# Patient Record
Sex: Female | Born: 1965 | Race: Black or African American | Hispanic: No | Marital: Single | Smoking: Never smoker
Health system: Southern US, Community
[De-identification: ages and names within clinical notes are randomized; demographics above are authoritative.]

## PROBLEM LIST (undated history)

## (undated) DIAGNOSIS — T7840XA Allergy, unspecified, initial encounter: Secondary | ICD-10-CM

## (undated) DIAGNOSIS — D219 Benign neoplasm of connective and other soft tissue, unspecified: Secondary | ICD-10-CM

## (undated) DIAGNOSIS — M722 Plantar fascial fibromatosis: Secondary | ICD-10-CM

## (undated) DIAGNOSIS — R102 Pelvic and perineal pain unspecified side: Secondary | ICD-10-CM

## (undated) DIAGNOSIS — I10 Essential (primary) hypertension: Secondary | ICD-10-CM

## (undated) HISTORY — DX: Plantar fascial fibromatosis: M72.2

## (undated) HISTORY — PX: UTERINE ARTERY EMBOLIZATION: SHX2629

## (undated) HISTORY — PX: GYNECOLOGIC CRYOSURGERY: SHX857

## (undated) HISTORY — DX: Allergy, unspecified, initial encounter: T78.40XA

## (undated) HISTORY — DX: Benign neoplasm of connective and other soft tissue, unspecified: D21.9

## (undated) HISTORY — PX: WISDOM TOOTH EXTRACTION: SHX21

## (undated) HISTORY — DX: Essential (primary) hypertension: I10

---

## 2000-05-21 ENCOUNTER — Other Ambulatory Visit: Admission: RE | Admit: 2000-05-21 | Discharge: 2000-05-21 | Payer: Self-pay | Admitting: Obstetrics and Gynecology

## 2001-09-24 ENCOUNTER — Emergency Department (HOSPITAL_COMMUNITY): Admission: EM | Admit: 2001-09-24 | Discharge: 2001-09-25 | Payer: Self-pay | Admitting: Emergency Medicine

## 2001-10-02 ENCOUNTER — Encounter: Admission: RE | Admit: 2001-10-02 | Discharge: 2001-10-02 | Payer: Self-pay | Admitting: Family Medicine

## 2001-10-11 ENCOUNTER — Other Ambulatory Visit: Admission: RE | Admit: 2001-10-11 | Discharge: 2001-10-11 | Payer: Self-pay | Admitting: Obstetrics and Gynecology

## 2001-10-31 ENCOUNTER — Encounter: Admission: RE | Admit: 2001-10-31 | Discharge: 2001-10-31 | Payer: Self-pay | Admitting: Family Medicine

## 2002-04-01 ENCOUNTER — Encounter: Admission: RE | Admit: 2002-04-01 | Discharge: 2002-04-01 | Payer: Self-pay | Admitting: Sports Medicine

## 2002-04-22 ENCOUNTER — Encounter: Admission: RE | Admit: 2002-04-22 | Discharge: 2002-04-22 | Payer: Self-pay | Admitting: Sports Medicine

## 2002-04-28 ENCOUNTER — Encounter: Admission: RE | Admit: 2002-04-28 | Discharge: 2002-04-28 | Payer: Self-pay | Admitting: Sports Medicine

## 2002-04-28 ENCOUNTER — Encounter: Payer: Self-pay | Admitting: Sports Medicine

## 2002-05-22 ENCOUNTER — Encounter: Admission: RE | Admit: 2002-05-22 | Discharge: 2002-05-22 | Payer: Self-pay | Admitting: Sports Medicine

## 2002-09-04 ENCOUNTER — Encounter: Admission: RE | Admit: 2002-09-04 | Discharge: 2002-09-04 | Payer: Self-pay | Admitting: Sports Medicine

## 2002-10-29 ENCOUNTER — Encounter: Admission: RE | Admit: 2002-10-29 | Discharge: 2002-10-29 | Payer: Self-pay | Admitting: Family Medicine

## 2002-12-31 ENCOUNTER — Other Ambulatory Visit: Admission: RE | Admit: 2002-12-31 | Discharge: 2002-12-31 | Payer: Self-pay | Admitting: Obstetrics and Gynecology

## 2003-03-24 ENCOUNTER — Encounter: Admission: RE | Admit: 2003-03-24 | Discharge: 2003-03-24 | Payer: Self-pay | Admitting: Family Medicine

## 2003-04-20 ENCOUNTER — Encounter: Admission: RE | Admit: 2003-04-20 | Discharge: 2003-04-20 | Payer: Self-pay | Admitting: Family Medicine

## 2003-06-30 ENCOUNTER — Encounter: Admission: RE | Admit: 2003-06-30 | Discharge: 2003-06-30 | Payer: Self-pay | Admitting: Family Medicine

## 2003-07-21 ENCOUNTER — Encounter: Admission: RE | Admit: 2003-07-21 | Discharge: 2003-07-21 | Payer: Self-pay | Admitting: Family Medicine

## 2003-09-15 ENCOUNTER — Encounter: Admission: RE | Admit: 2003-09-15 | Discharge: 2003-09-15 | Payer: Self-pay | Admitting: Family Medicine

## 2003-10-09 ENCOUNTER — Ambulatory Visit (HOSPITAL_COMMUNITY): Admission: RE | Admit: 2003-10-09 | Discharge: 2003-10-09 | Payer: Self-pay | Admitting: Neurology

## 2003-11-18 ENCOUNTER — Ambulatory Visit: Payer: Self-pay | Admitting: Sports Medicine

## 2004-02-02 ENCOUNTER — Ambulatory Visit: Payer: Self-pay | Admitting: Sports Medicine

## 2004-07-13 ENCOUNTER — Ambulatory Visit: Payer: Self-pay | Admitting: Sports Medicine

## 2004-09-13 ENCOUNTER — Ambulatory Visit: Payer: Self-pay | Admitting: Family Medicine

## 2005-03-28 ENCOUNTER — Ambulatory Visit: Payer: Self-pay | Admitting: Sports Medicine

## 2006-04-17 ENCOUNTER — Encounter (INDEPENDENT_AMBULATORY_CARE_PROVIDER_SITE_OTHER): Payer: Self-pay | Admitting: Sports Medicine

## 2006-04-27 ENCOUNTER — Telehealth: Payer: Self-pay | Admitting: *Deleted

## 2006-04-30 ENCOUNTER — Ambulatory Visit: Payer: Self-pay | Admitting: Family Medicine

## 2006-04-30 ENCOUNTER — Encounter: Payer: Self-pay | Admitting: Family Medicine

## 2006-04-30 DIAGNOSIS — F450 Somatization disorder: Secondary | ICD-10-CM | POA: Insufficient documentation

## 2006-04-30 LAB — CONVERTED CEMR LAB
BUN: 14 mg/dL (ref 6–23)
Creatinine, Ser: 0.91 mg/dL (ref 0.40–1.20)
Sodium: 138 meq/L (ref 135–145)
TSH: 0.982 microintl units/mL (ref 0.350–5.50)

## 2006-05-01 ENCOUNTER — Encounter (INDEPENDENT_AMBULATORY_CARE_PROVIDER_SITE_OTHER): Payer: Self-pay | Admitting: Sports Medicine

## 2006-05-25 ENCOUNTER — Telehealth (INDEPENDENT_AMBULATORY_CARE_PROVIDER_SITE_OTHER): Payer: Self-pay | Admitting: Sports Medicine

## 2006-12-18 ENCOUNTER — Telehealth: Payer: Self-pay | Admitting: *Deleted

## 2006-12-20 ENCOUNTER — Telehealth (INDEPENDENT_AMBULATORY_CARE_PROVIDER_SITE_OTHER): Payer: Self-pay | Admitting: Family Medicine

## 2006-12-24 ENCOUNTER — Encounter (INDEPENDENT_AMBULATORY_CARE_PROVIDER_SITE_OTHER): Payer: Self-pay | Admitting: Family Medicine

## 2006-12-24 ENCOUNTER — Ambulatory Visit: Payer: Self-pay | Admitting: Family Medicine

## 2006-12-24 ENCOUNTER — Other Ambulatory Visit: Admission: RE | Admit: 2006-12-24 | Discharge: 2006-12-24 | Payer: Self-pay | Admitting: Family Medicine

## 2006-12-24 DIAGNOSIS — N63 Unspecified lump in unspecified breast: Secondary | ICD-10-CM

## 2006-12-24 LAB — CONVERTED CEMR LAB
Bilirubin Urine: NEGATIVE
Blood in Urine, dipstick: NEGATIVE
Chlamydia, DNA Probe: NEGATIVE
GC Probe Amp, Genital: NEGATIVE
Glucose, Urine, Semiquant: NEGATIVE
Ketones, urine, test strip: NEGATIVE
Nitrite: NEGATIVE
Protein, U semiquant: NEGATIVE
Specific Gravity, Urine: 1.025
Urobilinogen, UA: 0.2
WBC Urine, dipstick: NEGATIVE
Whiff Test: NEGATIVE
pH: 6

## 2006-12-25 ENCOUNTER — Telehealth (INDEPENDENT_AMBULATORY_CARE_PROVIDER_SITE_OTHER): Payer: Self-pay | Admitting: *Deleted

## 2006-12-29 LAB — CONVERTED CEMR LAB: Pap Smear: NORMAL

## 2006-12-31 ENCOUNTER — Encounter: Payer: Self-pay | Admitting: Family Medicine

## 2007-04-22 ENCOUNTER — Telehealth: Payer: Self-pay | Admitting: *Deleted

## 2007-04-29 ENCOUNTER — Encounter: Payer: Self-pay | Admitting: Family Medicine

## 2007-09-13 ENCOUNTER — Telehealth: Payer: Self-pay | Admitting: *Deleted

## 2007-09-18 ENCOUNTER — Ambulatory Visit: Payer: Self-pay | Admitting: Family Medicine

## 2007-09-18 ENCOUNTER — Encounter (INDEPENDENT_AMBULATORY_CARE_PROVIDER_SITE_OTHER): Payer: Self-pay | Admitting: Family Medicine

## 2007-09-18 LAB — CONVERTED CEMR LAB
MCHC: 32.1 g/dL (ref 30.0–36.0)
Platelets: 186 10*3/uL (ref 150–400)
RBC: 4.69 M/uL (ref 3.87–5.11)
TSH: 0.802 microintl units/mL (ref 0.350–4.50)

## 2007-09-20 ENCOUNTER — Encounter (INDEPENDENT_AMBULATORY_CARE_PROVIDER_SITE_OTHER): Payer: Self-pay | Admitting: Family Medicine

## 2007-10-02 ENCOUNTER — Ambulatory Visit: Payer: Self-pay | Admitting: Family Medicine

## 2007-10-02 ENCOUNTER — Telehealth: Payer: Self-pay | Admitting: *Deleted

## 2007-10-09 ENCOUNTER — Ambulatory Visit: Payer: Self-pay | Admitting: Family Medicine

## 2007-10-10 ENCOUNTER — Encounter: Admission: RE | Admit: 2007-10-10 | Discharge: 2007-10-10 | Payer: Self-pay | Admitting: Family Medicine

## 2007-10-10 ENCOUNTER — Encounter: Payer: Self-pay | Admitting: Family Medicine

## 2007-10-10 ENCOUNTER — Telehealth (INDEPENDENT_AMBULATORY_CARE_PROVIDER_SITE_OTHER): Payer: Self-pay | Admitting: Family Medicine

## 2008-03-27 ENCOUNTER — Encounter: Payer: Self-pay | Admitting: Family Medicine

## 2008-03-31 ENCOUNTER — Encounter: Payer: Self-pay | Admitting: Family Medicine

## 2008-04-21 ENCOUNTER — Encounter: Payer: Self-pay | Admitting: Family Medicine

## 2008-04-22 ENCOUNTER — Ambulatory Visit: Payer: Self-pay | Admitting: Family Medicine

## 2008-04-22 ENCOUNTER — Telehealth: Payer: Self-pay | Admitting: Family Medicine

## 2008-04-24 ENCOUNTER — Telehealth (INDEPENDENT_AMBULATORY_CARE_PROVIDER_SITE_OTHER): Payer: Self-pay | Admitting: *Deleted

## 2008-04-27 ENCOUNTER — Telehealth (INDEPENDENT_AMBULATORY_CARE_PROVIDER_SITE_OTHER): Payer: Self-pay | Admitting: Family Medicine

## 2008-04-27 ENCOUNTER — Encounter: Admission: RE | Admit: 2008-04-27 | Discharge: 2008-04-27 | Payer: Self-pay | Admitting: Family Medicine

## 2008-06-16 ENCOUNTER — Telehealth (INDEPENDENT_AMBULATORY_CARE_PROVIDER_SITE_OTHER): Payer: Self-pay | Admitting: *Deleted

## 2008-07-28 ENCOUNTER — Encounter: Payer: Self-pay | Admitting: Family Medicine

## 2008-08-19 ENCOUNTER — Encounter: Payer: Self-pay | Admitting: Family Medicine

## 2008-09-30 ENCOUNTER — Ambulatory Visit: Payer: Self-pay | Admitting: Family Medicine

## 2008-09-30 DIAGNOSIS — R0789 Other chest pain: Secondary | ICD-10-CM

## 2008-09-30 LAB — CONVERTED CEMR LAB
AST: 18 units/L (ref 0–37)
Albumin: 4.1 g/dL (ref 3.5–5.2)
Alkaline Phosphatase: 43 units/L (ref 39–117)
MCHC: 32 g/dL (ref 30.0–36.0)
RBC: 4.85 M/uL (ref 3.87–5.11)
RDW: 13.4 % (ref 11.5–15.5)
Total Bilirubin: 0.3 mg/dL (ref 0.3–1.2)
Total Protein: 7.2 g/dL (ref 6.0–8.3)
WBC: 3.5 10*3/uL — ABNORMAL LOW (ref 4.0–10.5)

## 2008-10-01 ENCOUNTER — Encounter: Payer: Self-pay | Admitting: Family Medicine

## 2008-10-12 ENCOUNTER — Encounter: Payer: Self-pay | Admitting: Family Medicine

## 2008-10-13 ENCOUNTER — Encounter: Payer: Self-pay | Admitting: Family Medicine

## 2008-10-14 ENCOUNTER — Telehealth: Payer: Self-pay | Admitting: Family Medicine

## 2008-10-14 ENCOUNTER — Ambulatory Visit: Payer: Self-pay | Admitting: Family Medicine

## 2008-10-14 LAB — CONVERTED CEMR LAB: Rapid Strep: NEGATIVE

## 2008-10-15 ENCOUNTER — Telehealth (INDEPENDENT_AMBULATORY_CARE_PROVIDER_SITE_OTHER): Payer: Self-pay | Admitting: *Deleted

## 2008-10-16 ENCOUNTER — Ambulatory Visit: Payer: Self-pay | Admitting: Family Medicine

## 2008-10-16 LAB — CONVERTED CEMR LAB: Whiff Test: NEGATIVE

## 2008-10-19 ENCOUNTER — Telehealth (INDEPENDENT_AMBULATORY_CARE_PROVIDER_SITE_OTHER): Payer: Self-pay | Admitting: *Deleted

## 2008-10-26 ENCOUNTER — Encounter: Payer: Self-pay | Admitting: Family Medicine

## 2008-10-27 ENCOUNTER — Encounter: Payer: Self-pay | Admitting: Family Medicine

## 2008-11-09 ENCOUNTER — Encounter: Payer: Self-pay | Admitting: Family Medicine

## 2009-01-14 ENCOUNTER — Ambulatory Visit: Payer: Self-pay | Admitting: Family Medicine

## 2009-01-14 DIAGNOSIS — N644 Mastodynia: Secondary | ICD-10-CM

## 2009-12-14 ENCOUNTER — Encounter: Payer: Self-pay | Admitting: Family Medicine

## 2010-01-11 ENCOUNTER — Ambulatory Visit: Payer: Self-pay | Admitting: Family Medicine

## 2010-01-14 ENCOUNTER — Encounter
Admission: RE | Admit: 2010-01-14 | Discharge: 2010-01-14 | Payer: Self-pay | Source: Home / Self Care | Attending: Family Medicine | Admitting: Family Medicine

## 2010-02-16 ENCOUNTER — Ambulatory Visit
Admission: RE | Admit: 2010-02-16 | Discharge: 2010-02-16 | Payer: Self-pay | Source: Home / Self Care | Attending: Family Medicine | Admitting: Family Medicine

## 2010-02-16 ENCOUNTER — Encounter: Payer: Self-pay | Admitting: Family Medicine

## 2010-02-16 LAB — CONVERTED CEMR LAB
BUN: 13 mg/dL (ref 6–23)
CO2: 25 meq/L (ref 19–32)
Calcium: 9 mg/dL (ref 8.4–10.5)
Chloride: 106 meq/L (ref 96–112)
Glucose, Bld: 94 mg/dL (ref 70–99)
LDL Cholesterol: 115 mg/dL — ABNORMAL HIGH (ref 0–99)
Sodium: 138 meq/L (ref 135–145)
Triglycerides: 36 mg/dL (ref <150)
VLDL: 7 mg/dL (ref 0–40)

## 2010-02-18 ENCOUNTER — Encounter: Payer: Self-pay | Admitting: Family Medicine

## 2010-02-20 ENCOUNTER — Encounter: Payer: Self-pay | Admitting: Neurology

## 2010-02-21 ENCOUNTER — Encounter: Payer: Self-pay | Admitting: Family Medicine

## 2010-03-01 NOTE — Miscellaneous (Signed)
  Clinical Lists Changes  Orders: Added new Test order of Mammogram (Mammogram) - Signed      Complete Medication List: 1)  Allegra 180 Mg Tabs (Fexofenadine hcl) .... One tablet daily.  patient has failed trials of claritin and zyrtec. pt requests brand only 2)  Cvs Lubricant Eye Drops 0.5 % Soln (Carboxymethylcellulose sodium) .... Apply to eyes as directed for relief of itching 3)  Ibu 600 Mg Tabs (Ibuprofen) .Marland Kitchen.. 1 by mouth every 6 hours as needed for pain 4)  Epipen 0.3 Mg/0.88ml (1:1000) Devi (Epinephrine hcl (anaphylaxis)) .... Use as directed  order faxed to [facility] for [facility] sent [place] a request for  te order--diag mammo w [place] if necessary last mammo 04/22/2007 faxed back w order attached

## 2010-03-01 NOTE — Assessment & Plan Note (Signed)
Summary: strep per pt?/[place]/neal   Vital Signs:  Patient profile:   45 year old female Height:      63 inches (160.02 cm) Weight:      174 pounds (79.09 kg) BMI:     30.93 BSA:     1.82 Temp:     98.4 degrees F (36.9 degrees C) Pulse rate:   49 / minute BP sitting:   120 / 75  (right arm) Cuff size:   regular  Vitals Entered By: Dennison Nancy RN (October 14, 2008 2:19 PM) CC: [place] for throat pain Is Patient Diabetic? No Pain Assessment Patient in pain? yes     Location: throat Intensity: 8 Onset of pain  5 days ago   Primary Care Provider:  Denny Levy MD  CC:  [place] for throat pain.  History of Present Illness: 45 yo with   1. Sore throat: described as "irritable." worse over the past 2 days. assoc with HA, bilateral ear pain. she denies congestion, runny nose, trouble swallowing, cough, fever/chills, vision changes, sick contacts, rash, N/V/D. she endorses seasonal allergies. chart review: ENT referral completed. patient wants to know if she has strep throat.  Habits & Providers  Alcohol-Tobacco-Diet     Tobacco Status: never  Allergies (verified): No Known Drug Allergies  Past History:  Past Medical History: does not like to take meds,  negative rhuem  w/up  06, multiple somatic complaints  Review of Systems       per HPI  Physical Exam  General:  alert, well-developed, well-nourished, and well-hydrated.  vitals reviewed.  Head:  Normocephalic and atraumatic without obvious abnormalities.  Eyes:  No corneal or conjunctival inflammation noted. EOMI. Perrla.  Ears:  R ear normal and L ear normal.   Nose:  Nasal mucosa are pink and moist without lesions or exudates. Mouth:  Oral mucosa and oropharynx without lesions or exudates.  Neck:  supple, full ROM, no masses. Lungs:  CTAB Heart:  normal rate and regular rhythm.   Skin:  Intact without suspicious lesions or rashes.   Impression & Recommendations:  Problem # 1:  SORE THROAT (ICD-462) Assessment  New  Rapid strep negative. Exam normal. Advised patient to treat symptoms with throat lozenges, hot tea with honey, salt water gargle, ibuprofen, allergy medication. Follow up with PCP/ENT.  Her updated medication list for this problem includes:    Ibu 600 Mg Tabs (Ibuprofen) .Marland Kitchen... 1 by mouth every 6 hours as needed for pain  Orders: [facility]- Est Level  3 ([address]213)  Complete Medication List: 1)  Allegra 180 Mg Tabs (Fexofenadine hcl) .... One tablet daily.  patient has failed trials of claritin and zyrtec. pt requests brand only 2)  Cvs Lubricant Eye Drops 0.5 % Soln (Carboxymethylcellulose sodium) .... Apply to eyes as directed for relief of itching 3)  Ibu 600 Mg Tabs (Ibuprofen) .Marland Kitchen.. 1 by mouth every 6 hours as needed for pain 4)  Epipen 0.3 Mg/0.31ml (1:1000) Devi (Epinephrine hcl (anaphylaxis)) .... Use as directed  Other Orders: Rapid Strep-[facility] (04540)  Patient Instructions: 1)  It was nice to meet you today. 2)  Good news, you do NOT have strep throat! 3)  Some ways to treat your pain: ibuprofen, salt water gargles, warm tea with honey, and throat lozenges. 4)  Make sure to take your allergy medications daily. Prescriptions: IBU 600 MG  TABS (IBUPROFEN) 1 by mouth every 6 hours as needed for pain  #90 x 1   Entered and Authorized by:  Helane Rima MD   Signed by:   Helane Rima MD on 10/14/2008   Method used:   Electronically to        [facility] [phone]* (retail)       [address] [place]  [address]       Ph: [phone] or [phone]       Fax: [phone]   RxID:   [id number] ALLEGRA 180 MG  TABS (FEXOFENADINE HCL) One tablet daily.  Patient has failed trials of Claritin and Zyrtec. Pt requests BRAND only  #30 x 12   Entered and Authorized by:   Helane Rima MD   Signed by:   Helane Rima MD on 10/14/2008   Method used:   Electronically to        [facility] [phone]* (retail)       [address], [place]  [address]        Ph: [phone] or [phone]       Fax: [phone]   RxID:   [id number]   VITAL SIGNS    Calculated Weight:   174 lb.     Height:     63 in.     Temperature:     98.4 deg F.     Pulse rate:     49    Blood Pressure:   120/75 mmHg    Vital Signs:  Patient profile:   45 year old female Height:      63 inches (160.02 cm) Weight:      174 pounds (79.09 kg) BMI:     30.93 BSA:     1.82 Temp:     98.4 degrees F (36.9 degrees C) Pulse rate:   49 / minute BP sitting:   120 / 75  (right arm) Cuff size:   regular  Vitals Entered By: Dennison Nancy RN (October 14, 2008 2:19 PM)   Laboratory Results  Date/Time Received: October 14, 2008 2:20 PM  Date/Time Reported: October 14, 2008 2:29 PM   Other Tests  Rapid Strep: negative Comments: ...............test performed by......Marland KitchenBonnie A. [place], [place] (ASCP)

## 2010-03-03 NOTE — Assessment & Plan Note (Signed)
Summary: breast pain,df   Vital Signs:  Patient profile:   45 year old female Height:      63 inches Weight:      170.4 pounds BMI:     30.29 Temp:     98.3 degrees F oral Pulse rate:   57 / minute BP sitting:   142 / 86  (left arm) Cuff size:   regular  Vitals Entered By: Garen Grams LPN (January 11, 2010 9:09 AM) CC: breast pain Is Patient Diabetic? No   Primary Care Provider:  Denny Levy MD  CC:  breast pain.  History of Present Illness: 45 yo female returning with bilateral breast pain. Pt states that the pain not any better was when seen about a year ago.  Pt states she wanted to be seen to make sure no changes.  Pt states that the pain comes and goes, not associated with any time of day, or food, or any type of movements.  Pt states that it seems to radiate from the side of the breast to the middle of the breast when it does occur.  No masses felt by her, no nipple discharge, no skin changes no discoloration.  Denies fever, chills, nausea, vomiting, diarrhea or constipation shortness of breath cough, or chest pain.  No pain with exertion.   Habits & Providers  Alcohol-Tobacco-Diet     Tobacco Status: never  Current Medications (verified): 1)  Allegra 180 Mg  Tabs (Fexofenadine Hcl) .... One Tablet Daily.  Patient Has Failed Trials of Claritin and Zyrtec. Pt Requests Brand Only 2)  Cvs Lubricant Eye Drops 0.5 %  Soln (Carboxymethylcellulose Sodium) .... Apply To Eyes As Directed For Relief of Itching 3)  Ibu 600 Mg  Tabs (Ibuprofen) .Marland Kitchen.. 1 By Mouth Every 6 Hours As Needed For Pain 4)  Epipen 0.3 Mg/0.75ml (1:1000)  Devi (Epinephrine Hcl (Anaphylaxis)) .... Use As Directed  Allergies (verified): No Known Drug Allergies  Physical Exam  General:  alert, well-developed, well-nourished, and well-hydrated.  mildly anxious Eyes:  No corneal or conjunctival inflammation noted. EOMI. Perrla.  Mouth:  Oral mucosa and oropharynx without lesions or exudates.  Breasts:  Left  breast- nipple inverted but is at bseline.  No discoloration. no nipple discharge,  mild fibrocystic changes at 2pm  right breast no abnormal findings at all  Lungs:  CTAB Heart:  normal rate and regular rhythm.   Abdomen:  soft, non-tender, and normal bowel sounds.     Impression & Recommendations:  Problem # 1:  BREAST PAIN, BILATERAL (ICD-611.71) Assessment Unchanged Pt appears very anxious about it.  No msk findings, will get diagnostic mammogram to rule out any masees, none found on exam today except mild fibrocystic changes on left side at 2 pm location, no LAD.   Will have pt follow up Q 3 months for breast exam to help alleviate some anxiety.  Told pt self exams are improtant but not to beat herself up on missing one from time to time.  Orders: [facility]- Est Level  3 (99213) Mammogram (Diagnostic) (Mammo)  Complete Medication List: 1)  Allegra 180 Mg Tabs (Fexofenadine hcl) .... One tablet daily.  patient has failed trials of claritin and zyrtec. pt requests brand only 2)  Cvs Lubricant Eye Drops 0.5 % Soln (Carboxymethylcellulose sodium) .... Apply to eyes as directed for relief of itching 3)  Ibu 600 Mg Tabs (Ibuprofen) .Marland Kitchen.. 1 by mouth every 6 hours as needed for pain 4)  Epipen 0.3 Mg/0.40ml (1:1000) Devi (Epinephrine  hcl (anaphylaxis)) .... Use as directed   Orders Added: 1)  [facility]- Est Level  3 [[address]] 2)  Mammogram (Diagnostic) [Mammo]

## 2010-03-03 NOTE — Assessment & Plan Note (Signed)
Summary: breast pain/bmc   Vital Signs:  Patient profile:   45 year old female Height:      63 inches Weight:      170 pounds Temp:     98.7 degrees F oral Pulse rate:   50 / minute Pulse rhythm:   regular BP sitting:   126 / 82  (right arm) Cuff size:   regular  Vitals Entered By: Loralee Pacas CMA (February 16, 2010 8:58 AM) CC: follow-up visit bilateral breast pain Is Patient Diabetic? No Pain Assessment Patient in pain? no      Comments pt is not having any pain at present she had a mammogram done since last ov and nothing was found.   Primary Care Provider:  Denny Levy MD  CC:  follow-up visit bilateral breast pain.  History of Present Illness: wants CPE--except she does not want her pap right now. She did have her mammogram. Is doing well. Working cleaning houses right now. No longer has medicaid---has orange card. Would like [place] back to Dr Stefano Gaul for pap and [place] as he did not do a transvaginal (evidently he did abdominal)---she is worried about her fibroids growing  continues to have occasional sharp breast pains which worry her an awful lot--her Mom had breast ca and her signs were evidently sharppains inher breast.   has a bruised area on her right shin--wonders how long it willtake to go away. Knocked against a oiece of furniture with her leg a couple of weeks ago. Itis getting better but worries her.   she is otehrwise without complaint  Habits & Providers  Alcohol-Tobacco-Diet     Tobacco Status: never  Exercise-Depression-Behavior     Have you felt down or hopeless? no     Have you felt little pleasure in things? no     Depression Counseling: not indicated; screening negative for depression     Seat Belt Use: always  Current Medications (verified): 1)  Allegra 180 Mg  Tabs (Fexofenadine Hcl) .... One Tablet Daily.  Patient Has Failed Trials of Claritin and Zyrtec. Pt Requests Brand Only 2)  Cvs Lubricant Eye Drops 0.5 %  Soln (Carboxymethylcellulose  Sodium) .... Apply To Eyes As Directed For Relief of Itching 3)  Ibu 600 Mg  Tabs (Ibuprofen) .Marland Kitchen.. 1 By Mouth Every 6 Hours As Needed For Pain 4)  Epipen 0.3 Mg/0.7ml (1:1000)  Devi (Epinephrine Hcl (Anaphylaxis)) .... 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    Physical Exam  General:  alert, well-developed, well-nourished, and well-hydrated.   Head:  normocephalic.   Eyes:  vision grossly intact, pupils equal, pupils round, and pupils reactive to light.   Ears:  R ear normal and L ear normal.   Neck:  supple, full ROM, no masses, no thyromegaly, and no carotid bruits.   Breasts:  deferred by patient Lungs:  normal respiratory effort.   Heart:  normal rate,  regular rhythm, and no murmur.   Abdomen:  soft, non-tender, normal bowel sounds, no distention, and no masses.   Genitalia:  deferred by patient Msk:  normal ROM, no joint tenderness, and no joint swelling.   small 2 cm bruised area lft shin. Pulses:  2+ B U/L extremity Extremities:  no edema Neurologic:  alert & oriented X3, strength normal in all extremities, gait normal, and DTRs symmetrical and normal.   Skin:  turgor normal and color normal.   Psych:  Oriented X3 and memory intact for recent and remote.  speech is a bit pressured as usual. asks  and answers quiestions appropriately.  Interactive and seems happy   Impression & Recommendations:  Problem # 1:  BREAST PAIN, BILATERAL (ICD-611.71)  again discussed benign nature of her intermittent breast pains. she willlikely continue with yearly mammogram Orders: [facility]- Est  Level 4 ([address])  Problem # 2:  CHEST PAIN, ATYPICAL (ICD-786.59) Orders: Basic Met-[facility] ([address]11) Lipid-[facility] ([id number]) [facility]- Est  Level 4 ([address]214)this is eally more of a beast pain but she is very worried about her healthwe will check basic labs. she is fasting  Problem # 3:  DISORDER, SOMATIZATION (ICD-300.81)  this iseems pretty well controlled at the moment.  Orders: [facility]- Est  Level 4 ([address])  Problem # 4:  Preventive Health Care (ICD-V70.0) she is overdue for pap--says she is not mentally "psyched" for it today---wants me to send her back to Dr Stefano Gaul for pap and he can re-eval her fibroids at that time.  If her orange card does not reimburse that practice she will call us back and get a pap in next 6 weeks.  Complete Medication List: 1)  Allegra 180 Mg Tabs (Fexofenadine hcl) .... One tablet daily.  patient has failed trials of claritin and zyrtec. pt requests brand only 2)  Cvs Lubricant Eye Drops 0.5 % Soln (Carboxymethylcellulose sodium) .... Apply to eyes as directed for relief of itching 3)  Ibu 600 Mg Tabs (Ibuprofen) .Marland Kitchen.. 1 by mouth every 6 hours as needed for pain 4)  Epipen 0.3 Mg/0.73ml (1:1000) Devi (Epinephrine hcl (anaphylaxis)) .... Use as directed Basic Met-[facility] [phone]) Lipid-[facility] [phone]) [facility]- Est  Level 4 ([address]214)   Orders Added: 1)  Basic Met-[facility] [02725-36644] 2)  Lipid-[facility] [80061-22930] 3)  [facility]- Est  Level 4 [03474]    Prevention & Chronic Care Immunizations   Influenza vaccine: Fluvax 3+  (12/24/2006)   Influenza vaccine due: 12/24/2007    Tetanus booster: 09/18/2007: Tdap   Tetanus booster due: 09/17/2017    Pneumococcal vaccine: Not documented  Other  Screening   Pap smear: 07/28/2008  (09/30/2008)   Pap smear due: 12/2009    Mammogram: BI-RADS CATEGORY 1:  Negative.^MM DIGITAL DIAGNOSTIC BILAT  (01/14/2010)   Mammogram due: 04/2009   Smoking status: never  (02/16/2010)  Lipids   Total Cholesterol: Not documented   LDL: Not documented   LDL Direct: 123  (09/30/2008)   HDL: Not documented   Triglycerides: Not documented    Impression & Recommendations:   Orders: [facility]- Est  Level 4 ([address])    Orders: Basic Met-[facility] ([id number]) Lipid-[facility] (51884-16606)   Orders: Basic Met-[facility] (30160-10932) Lipid-[facility] (35573-22025) [facility]- Est  Level 4 ([address]06)     Orders: [facility]- Est  Level 4 (23762)    Complete Medication List: 1)  Allegra 180 Mg Tabs (Fexofenadine hcl) .... One tablet daily.  patient has failed trials of claritin and zyrtec. pt requests brand only 2)  Cvs Lubricant Eye Drops 0.5 %  Soln (Carboxymethylcellulose sodium) .... Apply to eyes as directed for relief of itching 3)  Ibu 600 Mg Tabs (Ibuprofen) .Marland Kitchen.. 1 by mouth every 6 hours as needed for pain 4)  Epipen 0.3 Mg/0.68ml (1:1000) Devi (Epinephrine hcl (anaphylaxis)) .... Use as directed

## 2010-03-03 NOTE — Letter (Addendum)
Summary: Lipid Letter  [facility]  [address]   [place], [place] [address]   Phone: [phone]  Fax: [phone]    02/18/2010  Islah Eve [address] [place], [place]  [address]  Dear Rexene Edison:  We have carefully reviewed your last lipid profile from 02/16/2010 and the results are noted below with a summary of recommendations for lipid management.    Cholesterol:       175     Goal: < 200   HDL "good" Cholesterol:   53     Goal: > 45   LDL "bad" Cholesterol:   115     Goal: < 130   Triglycerides:       36     Goal: < 150    THIS LOOKS GREAT! All of your other labs including blood sugar, kidney function and electrolytes were normal.       Current Medications: 1)    Allegra 180 Mg  Tabs (Fexofenadine hcl) .... One tablet daily.  patient has failed trials of claritin and zyrtec. pt requests brand only 2)    Cvs Lubricant Eye Drops 0.5 %  Soln (Carboxymethylcellulose sodium) .... Apply to eyes as directed for relief of itching 3)    Ibu 600 Mg  Tabs (Ibuprofen) .Marland Kitchen.. 1 by mouth every 6 hours as needed for pain 4)    Epipen 0.3 Mg/0.67ml (1:1000)  Devi (Epinephrine hcl (anaphylaxis)) .... Use as directed  If you have any questions, please call. We appreciate being able to work with you.   Sincerely,    [facility] Denny Levy MD  Appended Document: Lipid Letter mailed

## 2010-03-10 ENCOUNTER — Encounter: Payer: Self-pay | Admitting: *Deleted

## 2010-03-24 ENCOUNTER — Telehealth: Payer: Self-pay | Admitting: *Deleted

## 2010-03-24 NOTE — Telephone Encounter (Signed)
Patient calls reporting she has been bothered with a vaginal irritation and scratchy throat since December. She is concerned that she has an overgrowth of yeast in her body and  wans to be checked.  Offered work in appointment for tomorrow but she  does not want to come because she is on her period. Appointment scheduled for next Tuesday. denies any discharge or odor or pain.

## 2010-03-29 ENCOUNTER — Ambulatory Visit: Payer: Self-pay | Admitting: Family Medicine

## 2010-04-06 ENCOUNTER — Ambulatory Visit (INDEPENDENT_AMBULATORY_CARE_PROVIDER_SITE_OTHER): Payer: Self-pay | Admitting: Family Medicine

## 2010-04-06 ENCOUNTER — Encounter: Payer: Self-pay | Admitting: Family Medicine

## 2010-04-06 DIAGNOSIS — Z124 Encounter for screening for malignant neoplasm of cervix: Secondary | ICD-10-CM

## 2010-04-06 DIAGNOSIS — N760 Acute vaginitis: Secondary | ICD-10-CM

## 2010-04-06 LAB — POCT WET PREP (WET MOUNT)
Clue Cells Wet Prep HPF POC: NEGATIVE
Yeast Wet Prep HPF POC: NEGATIVE

## 2010-04-06 NOTE — Progress Notes (Signed)
  Subjective:    Patient ID: Carrie Roth, female    DOB: Jun 05, 1965, 45 y.o.   MRN: [id number]  HPI  Concerned she has a candida infection in her vagina, possibly in her throat, maybe widespread in her body. Has been reading about candida infections on net and is quite worried. Symptoms are a "tingling" in her vagina ,ost of the time. No itching, no discharge, no pain, no bleeding. No abdominal pain and no fever. No concerns for STD--not sexually active currently. Also has a dry sensation in her throat and mouth much of the tie--no white areas on tongue--no mouth blisters or sores.  Has generally been feeling well  But is concerned about these issues. Also wants to get her pap smear done.  Review of Systems see hpi   Objective:   Physical Exam  Constitutional: She appears well-developed and well-nourished.  HENT:  Nose: Nose normal.  Mouth/Throat: Oropharynx is clear and moist. No oropharyngeal exudate.  Neck: Normal range of motion. No thyromegaly present.  Cardiovascular: Normal rate and regular rhythm.   Pulmonary/Chest: Effort normal and breath sounds normal. She has no wheezes.  Abdominal: Soft. Bowel sounds are normal.  Genitourinary: Vagina normal.       No adnexal masses or tenderness, her uterus is slightly enlarged (grapefruit size) no nodularity, no tenderness,. There is no vaginal d/c, no lesions or blisters. Cervix appears normal and no cervical motion tenderness.  Lymphadenopathy:    She has no cervical adenopathy.  Psychiatric:       A little anxious with some very mild pressured speech when she starts discussing her internet search re candida infections. Otherwise she is normally interactive and appears calm, asks and answers questions normally.          Assessment & Plan:  1. Concern for yeast infection--I think this is related to her somatization disorder. I inspected her OP, did wet prep and will contact her with results which I expect will be negative. I tried to  reassure her re generalized yeast infection, yeast in mouth etc. She will rtc prn re these issues and her next pap can be done in 1-3 years

## 2010-04-08 ENCOUNTER — Telehealth: Payer: Self-pay | Admitting: Family Medicine

## 2010-04-08 NOTE — Telephone Encounter (Signed)
Dwt Please let her know there was NO YEAST or BV on her wet prep. THANKS! Gean Laursen

## 2010-04-08 NOTE — Telephone Encounter (Signed)
LVM for patient to call back. ?

## 2010-04-11 NOTE — Telephone Encounter (Signed)
Attempted to contact pt with alternate number but that line is busy. Will try again later.Carrie Roth

## 2010-04-11 NOTE — Telephone Encounter (Signed)
LVM for patient to call back on the number she called and left for me..... Cell # [phone]

## 2010-04-11 NOTE — Telephone Encounter (Signed)
Spoke with patient and informed of results.

## 2010-04-11 NOTE — Telephone Encounter (Signed)
LVM for patient to call back. ?

## 2010-04-12 ENCOUNTER — Other Ambulatory Visit: Payer: Self-pay | Admitting: Family Medicine

## 2010-04-12 MED ORDER — EPINEPHRINE 0.3 MG/0.3ML IJ DEVI: 0.3000 mg | INTRAMUSCULAR | Status: DC

## 2010-04-12 MED ORDER — IBUPROFEN 600 MG PO TABS: 600.0000 mg | ORAL_TABLET | Freq: Four times a day (QID) | ORAL | Status: DC | PRN

## 2010-04-12 MED ORDER — FEXOFENADINE HCL 180 MG PO TABS: 180.0000 mg | ORAL_TABLET | Freq: Every day | ORAL | Status: DC

## 2010-04-12 NOTE — Telephone Encounter (Signed)
Dr. Jennette Kettle,  Ms Henderson stated that she requested refills at her last visit.  I started the refills for you however they seem to be a little more complicated than I thought... pls advise/edit and/or finish LOL!! Thanks bunches.  Laureen Ochs, Viann Shove

## 2010-04-12 NOTE — Telephone Encounter (Signed)
Was here the other day and thought that the doctor had called in refills for her -  Epipen, allergy meds & ibuprofen [facility] [address]

## 2010-04-19 ENCOUNTER — Encounter: Payer: Self-pay | Admitting: Family Medicine

## 2010-04-28 NOTE — Miscellaneous (Signed)
  Clinical Lists Changes       Complete Medication List: 1)  Allegra 180 Mg Tabs (Fexofenadine hcl) .... One tablet daily.  patient has failed trials of claritin and zyrtec. pt requests brand only 2)  Cvs Lubricant Eye Drops 0.5 % Soln (Carboxymethylcellulose sodium) .... Apply to eyes as directed for relief of itching 3)  Ibu 600 Mg Tabs (Ibuprofen) .Marland Kitchen.. 1 by mouth every 6 hours as needed for pain 4)  Epipen 0.3 Mg/0.7ml (1:1000) Devi (Epinephrine hcl (anaphylaxis)) .... Use as directed   Past History:  Past Medical History: Last updated: 10/14/2008 does not like to take meds,  negative rhuem  w/up  06, multiple somatic complaints  Past Surgical History: Last updated: 03/29/2006 cryotherapy--cervix - 01/31/1988, ct head-neg - 08/30/2001  Family History: Last updated: 03/29/2006 breast CA 1st degree--mom, mom--htn  Social History: Last updated: 02/16/2010 lives with 31 year old son (2012); has own Engineer, drilling; no smoking

## 2010-08-13 ENCOUNTER — Telehealth: Payer: Self-pay | Admitting: Family Medicine

## 2010-08-13 ENCOUNTER — Inpatient Hospital Stay (INDEPENDENT_AMBULATORY_CARE_PROVIDER_SITE_OTHER)
Admission: RE | Admit: 2010-08-13 | Discharge: 2010-08-13 | Disposition: A | Discharge status: Home / Self Care | Payer: Self-pay | Source: Ambulatory Visit | Attending: Family Medicine | Admitting: Family Medicine

## 2010-08-13 DIAGNOSIS — B373 Candidiasis of vulva and vagina: Secondary | ICD-10-CM

## 2010-08-13 DIAGNOSIS — N760 Acute vaginitis: Secondary | ICD-10-CM

## 2010-08-13 LAB — POCT URINALYSIS DIP (DEVICE)
Bilirubin Urine: NEGATIVE
Nitrite: NEGATIVE
Protein, ur: NEGATIVE mg/dL
Urobilinogen, UA: 0.2 mg/dL (ref 0.0–1.0)

## 2010-08-13 LAB — WET PREP, GENITAL

## 2010-08-13 LAB — POCT PREGNANCY, URINE: Preg Test, Ur: NEGATIVE

## 2010-08-13 NOTE — Telephone Encounter (Signed)
Thinks she has a yeast infxn.  Has d/c.  Tried to make appt yesterday.  Went to [facility] and got OTC 3 day treatment. Still itching and miserable.   Worried to go to UC since orange card expired.  Explained likely if she recertifies it will cover the visit (though I told her I am not in the financial department).  She agrees to go to UC.

## 2011-01-17 ENCOUNTER — Ambulatory Visit (INDEPENDENT_AMBULATORY_CARE_PROVIDER_SITE_OTHER): Payer: Self-pay | Admitting: Family Medicine

## 2011-01-17 ENCOUNTER — Encounter: Payer: Self-pay | Admitting: Family Medicine

## 2011-01-17 VITALS — BP 147/91 | HR 57 | Temp 98.4°F | Wt 185.3 lb

## 2011-01-17 DIAGNOSIS — K029 Dental caries, unspecified: Secondary | ICD-10-CM

## 2011-01-17 NOTE — Progress Notes (Signed)
Subjective: Pt reports tooth pain in left upper jaw and right lower jaw that has been present and gradually worsening for last month.  Made worse by eating or cold.  Nothing makes better.  No fevers/chills.  No drainage or swelling.  No problems swallowing.  Objective:  Filed Vitals:   01/17/11 1108  BP: 147/91  Pulse: 57  Temp: 98.4 F (36.9 C)   Gen: NAD HEENT: Mild tenderness on pressure over the left upper and right lower jaw, no swelling, no obvious deformity of teeth, no redness of gums.  Teeth in relatively good repair.  Assessment/Plan: Dental caries, no need for Abx at this time.  As has only orange card, is not able to be seen at dental clinic.  Provided with resources for other dental care.  Given red flags for return.  Please also see individual problems in problem list for problem-specific plans.

## 2011-01-30 ENCOUNTER — Other Ambulatory Visit: Payer: Self-pay | Admitting: Family Medicine

## 2011-01-30 DIAGNOSIS — Z1231 Encounter for screening mammogram for malignant neoplasm of breast: Secondary | ICD-10-CM

## 2011-02-13 ENCOUNTER — Ambulatory Visit
Admission: RE | Admit: 2011-02-13 | Discharge: 2011-02-13 | Disposition: A | Discharge status: Home / Self Care | Payer: Self-pay | Source: Ambulatory Visit | Attending: Family Medicine | Admitting: Family Medicine

## 2011-02-13 DIAGNOSIS — Z1231 Encounter for screening mammogram for malignant neoplasm of breast: Secondary | ICD-10-CM

## 2011-02-22 ENCOUNTER — Ambulatory Visit (INDEPENDENT_AMBULATORY_CARE_PROVIDER_SITE_OTHER): Payer: Self-pay | Admitting: Family Medicine

## 2011-02-22 ENCOUNTER — Encounter: Payer: Self-pay | Admitting: Family Medicine

## 2011-02-22 VITALS — BP 136/91 | HR 58 | Temp 98.4°F | Ht 63.6 in | Wt 187.0 lb

## 2011-02-22 DIAGNOSIS — R102 Pelvic and perineal pain: Secondary | ICD-10-CM

## 2011-02-22 DIAGNOSIS — N949 Unspecified condition associated with female genital organs and menstrual cycle: Secondary | ICD-10-CM

## 2011-02-22 DIAGNOSIS — K6289 Other specified diseases of anus and rectum: Secondary | ICD-10-CM

## 2011-02-22 DIAGNOSIS — F450 Somatization disorder: Secondary | ICD-10-CM

## 2011-02-22 LAB — COMPREHENSIVE METABOLIC PANEL
AST: 21 U/L (ref 0–37)
Chloride: 105 mEq/L (ref 96–112)
Glucose, Bld: 85 mg/dL (ref 70–99)
Sodium: 137 mEq/L (ref 135–145)

## 2011-02-22 LAB — CBC
HCT: 44.7 % (ref 36.0–46.0)
MCHC: 31.5 g/dL (ref 30.0–36.0)
MCV: 92.7 fL (ref 78.0–100.0)
Platelets: 190 10*3/uL (ref 150–400)
RBC: 4.82 MIL/uL (ref 3.87–5.11)
RDW: 13.3 % (ref 11.5–15.5)

## 2011-02-22 NOTE — Progress Notes (Signed)
  Subjective:    Patient ID: Carrie Roth, female    DOB: November 07, 1965, 46 y.o.   MRN: [id number]  HPI  Complaints of rectal pain that radiates to the mid pelvis and lasts several minutes. She has had multiple occasions to this occurring in the last month. It has awakened her from sleep twice. The pain is 10 out of 10. She's had no change in her bowel movements, seen no blood, no change in her urination. Very worried she has soemthing seriously wrong with her. Wants a FULL work up. Review of Systems denies fever, sweats, chills, unusual weight change       Objective:   Physical Exam  Vital signs reviewed GENERALl: Well developed, well nourished, in no acute distress. NECK: Supple, FROM, without lymphadenopathy.  LUNGS: clear to auscultation bilaterally. No wheezes or rales. HEART: Regular rate and rhythm, no murmurs ABDOMEN: soft with positive bowel sounds, no rebound or guarding. No masses RECTAL: normal sphincter tone, small external hemorrhoids.  PELVIC normal bimanual NEURO: No gross focal deficits PSYCH: AxOx4. Slightly pressured speech.         Assessment & Plan:  1. Atypical rectal / pelvic pain. Sounds most c/w some type of bowel spasm. She is extremely worried---so much so that I agreed to do a CT abdomen and pelvis which is the only thing I think will reassure her. Also check general lab work.  F/u w me in 3 weeks

## 2011-02-27 ENCOUNTER — Ambulatory Visit (HOSPITAL_COMMUNITY)
Admission: RE | Admit: 2011-02-27 | Discharge: 2011-02-27 | Disposition: A | Discharge status: Home / Self Care | Payer: Self-pay | Source: Ambulatory Visit | Attending: Family Medicine | Admitting: Family Medicine

## 2011-02-27 ENCOUNTER — Other Ambulatory Visit: Payer: Self-pay | Admitting: Family Medicine

## 2011-02-27 DIAGNOSIS — R102 Pelvic and perineal pain: Secondary | ICD-10-CM

## 2011-02-27 DIAGNOSIS — K6289 Other specified diseases of anus and rectum: Secondary | ICD-10-CM

## 2011-02-28 ENCOUNTER — Ambulatory Visit (HOSPITAL_COMMUNITY)
Admission: RE | Admit: 2011-02-28 | Discharge: 2011-02-28 | Disposition: A | Discharge status: Home / Self Care | Payer: Self-pay | Source: Ambulatory Visit | Attending: Family Medicine | Admitting: Family Medicine

## 2011-02-28 ENCOUNTER — Encounter: Payer: Self-pay | Admitting: Family Medicine

## 2011-02-28 DIAGNOSIS — D259 Leiomyoma of uterus, unspecified: Secondary | ICD-10-CM | POA: Insufficient documentation

## 2011-02-28 DIAGNOSIS — K6289 Other specified diseases of anus and rectum: Secondary | ICD-10-CM | POA: Insufficient documentation

## 2011-02-28 MED ORDER — IOHEXOL 300 MG/ML  SOLN
80.0000 mL | Freq: Once | INTRAMUSCULAR | Status: AC | PRN
  Administered 2011-02-28: 80 mL via INTRAVENOUS

## 2011-03-22 ENCOUNTER — Encounter: Payer: Self-pay | Admitting: Family Medicine

## 2011-03-22 ENCOUNTER — Ambulatory Visit (INDEPENDENT_AMBULATORY_CARE_PROVIDER_SITE_OTHER): Payer: Self-pay | Admitting: Family Medicine

## 2011-03-22 VITALS — BP 120/84 | HR 56 | Temp 98.3°F | Ht 65.0 in | Wt 190.0 lb

## 2011-03-22 DIAGNOSIS — D259 Leiomyoma of uterus, unspecified: Secondary | ICD-10-CM

## 2011-03-22 DIAGNOSIS — D219 Benign neoplasm of connective and other soft tissue, unspecified: Secondary | ICD-10-CM | POA: Insufficient documentation

## 2011-03-22 DIAGNOSIS — R350 Frequency of micturition: Secondary | ICD-10-CM

## 2011-03-22 DIAGNOSIS — R252 Cramp and spasm: Secondary | ICD-10-CM

## 2011-03-22 MED ORDER — OXYBUTYNIN CHLORIDE ER 5 MG PO TB24: 5.0000 mg | ORAL_TABLET | Freq: Every day | ORAL | Status: DC

## 2011-03-22 NOTE — Progress Notes (Signed)
  Subjective:    Patient ID: Carrie Roth, female    DOB: 1965-10-05, 46 y.o.   MRN: [id number]  HPI  Complaint of pelvic pressure on her bladder. When she urinates, she feels that she still has to urinate again within a few minutes. She wants to discuss the findings of her CT scan. She's also having some leg pains that worry her for clot. #1. Urination does not burn. She's had no more abdominal or pelvic pains. She does feel like she has to urinate a lot and does not feel like she's fully entered her bladder afterwards. #2. She's having calf cramps intermittently sometimes the right one sometimes the left lung. They usually last a few seconds to minutes. She's not had any leg swelling. Noted no leg warmth or erythema.  Review of Systems    denies fever, sweats, chills. No numbness in her feet.Vital signs reviewed.   Objective:   Physical Exam  GENERAL: Well developed, well nourished, no acute distress ABDOMEN: Soft positive bowel sounds nontender nondistended. GU: Externally normal. Bimanual exam reveals a large grapefruit-sized firm uterus that is nontender teary at the bladder is nontender. There is no sign of cystocele. There are no adnexal masses or tenderness although the adnexal exam is limited somewhat by the size of the uterus. Imaging review: CT scan reviewed with the patient.     Assessment & Plan:  #1. Urinary frequency and bladder pressure secondary to large uterine fibroids. We discussed at length. She is very uncertain about whether or not she would consider hysterectomy. She might consider other forms such as myomectomy. I doubt that that would be beneficial for her given the large number of fibroids she has. I will set her up with GYN so that they can fully discussed with her her options. In the meantime I will try her on some oxybutynin to see if this decreases her urinary frequency. #2. Leg pains that appear to be calf cramps. We discussed some stretching exercises. Total time  spent face-to-face was 40 minutes.

## 2011-03-23 ENCOUNTER — Telehealth: Payer: Self-pay | Admitting: Family Medicine

## 2011-03-23 ENCOUNTER — Encounter: Payer: Self-pay | Admitting: *Deleted

## 2011-03-23 NOTE — Telephone Encounter (Signed)
Message copied by Nestor Ramp on Thu Mar 23, 2011  7:27 PM ------      Message from: Zola Button L      Created: Thu Mar 23, 2011  8:34 AM      Regarding: Referral Appointment       We have made Carrie Roth an appointment in our clinic on April 27, 2011 at 1:45.  I have left the patient a message as well as sent her a letter about this appointment.            Thanks,

## 2011-04-27 ENCOUNTER — Ambulatory Visit (INDEPENDENT_AMBULATORY_CARE_PROVIDER_SITE_OTHER): Payer: Self-pay | Admitting: Obstetrics & Gynecology

## 2011-04-27 ENCOUNTER — Encounter: Payer: Self-pay | Admitting: Obstetrics & Gynecology

## 2011-04-27 VITALS — BP 134/83 | HR 56 | Temp 98.0°F | Ht 63.0 in | Wt 189.0 lb

## 2011-04-27 DIAGNOSIS — D219 Benign neoplasm of connective and other soft tissue, unspecified: Secondary | ICD-10-CM

## 2011-04-27 DIAGNOSIS — D259 Leiomyoma of uterus, unspecified: Secondary | ICD-10-CM

## 2011-04-27 LAB — POCT URINALYSIS DIP (DEVICE)
Nitrite: NEGATIVE
Protein, ur: 30 mg/dL — AB
Urobilinogen, UA: 0.2 mg/dL (ref 0.0–1.0)

## 2011-04-27 NOTE — Patient Instructions (Signed)
Fibroids You have been diagnosed as having a fibroid. Fibroids are smooth muscle lumps (tumors) which can occur any place in a woman's body. They are usually in the womb (uterus). The most common problem (symptom) of fibroids is bleeding. Over time this may cause low red blood cells (anemia). Other symptoms include feelings of pressure and pain in the pelvis. The diagnosis (learning what is wrong) of fibroids is made by physical exam. Sometimes tests such as an ultrasound are used. This is helpful when fibroids are felt around the ovaries and to look for tumors. TREATMENT   Most fibroids do not need surgical or medical treatment. Sometimes a tissue sample (biopsy) of the lining of the uterus is done to rule out cancer. If there is no cancer and only a small amount of bleeding, the problem can be watched.   Hormonal treatment can improve the problem.   When surgery is needed, it can consist of removing the fibroid. Vaginal birth may not be possible after the removal of fibroids. This depends on where they are and the extent of surgery. When pregnancy occurs with fibroids it is usually normal.   Your caregiver can help decide which treatments are best for you.  HOME CARE INSTRUCTIONS   Do not use aspirin as this may increase bleeding problems.   If your periods (menses) are heavy, record the number of pads or tampons used per month. Bring this information to your caregiver. This can help them determine the best treatment for you.  SEEK IMMEDIATE MEDICAL CARE IF:  You have pelvic pain or cramps not controlled with medications, or experience a sudden increase in pain.   You have an increase of pelvic bleeding between and during menses.   You feel lightheaded or have fainting spells.   You develop worsening belly (abdominal) pain.  Document Released: 01/14/2000 Document Revised: 01/05/2011 Document Reviewed: 09/05/2007 [facility] Patient Information 2012 [place], [place].  Myomectomy Myoma is a  non-cancerous tumor made up of fibrous tissue. It is also called leiomyoma, but more often called a fibroid tumor. Myomectomy is the removal of a fibroid tumor without removing another organ, like the uterus or ovary, with it. Fibroids range from the size of a pea to a grapefruit. They are rarely cancerous. Myomas only need treatment when they are growing or when they cause symptoms, such aspain, pressure, bleeding, and pain with intercourse. LET YOUR CAREGIVER KNOW ABOUT:  Any allergies, especially to medicines.   If you develop a cold or an infection before your surgery.   Medicines taken, including vitamins, herbs, eyedrops, over-ther-counter medicines, and creams.   Use of steroids (by mouth or creams).   Previous problems with numbing medicines.   History of blood clots or other bleeding problems.   Other health problems, such as diabetes, kidney, heart, or lung problems.   Previous surgery.   Possibility of pregnancy, if this applies.  RISKS AND COMPLICATIONS   Excessive bleeding.   Infection.   Injury to other organs.   Blood clots in the legs, chest, and brain.   Scar tissue (adhesions) on other organs and in the pelvis.   Death during or after the surgery.  BEFORE THE PROCEDURE  Follow your caregiver's advice regarding your surgery and preparing for surgery.   Avoid taking aspirin or blood thinners as directed by your caregiver.   DO NOT eat or drink anything after midnight on the night before surgery, or as directed by your caregiver.   DO NOT smoke (if you smoke)  for 2 weeks before the surgery.   DO NOT drink alcohol the day before the surgery.   If you are admitted the day of the surgery,arrive1 hour before your surgery is scheduled.   Arrange to have someone take you home from the hospital.   Arrange to have someone care for you when you go home.  PROCEDURE There are several ways to perform a myomectomy:  Hysteroscopy myomectomy. A lighted tube is  inserted inside the uterus. The tube will remove the fibroid. This is used when the fibroid is inside the cavity of the uterus.   Laparoscopic myomectomy. A long, lighted tube is inserted through 2 or 3 small incisions to see the organs in the pelvis. The fibroid is removed.   Myomectomy through a surgical cut (incicion) in the abdomen. The fibroid is removed through an incision made in the stomach. This way is performed when thethe fibroid cannot be removed with a hysteroscope or laprascope.  AFTER THE PROCEDURE  If you had laparoscopic or hysteroscopic myomectomy, you may go home the same day or stay overnight.   If you had abdominal myomectomy, you may stay in the hospital a few days.   Your intravenous (IV)access tube and catheter will be removed in 1 or 2 days.   If you stay in the hospital, your caregiver will order pain medicine and a sleeping pill, if needed.   You may be placed on an antibiotic medicine, if needed.   You may be given written instructions and medicines before you are sent home.  Document Released: 11/13/2006 Document Revised: 01/05/2011 Document Reviewed: 11/25/2008 [facility] Patient Information 2012 [place], [place].   Uterine Artery Embolization for Fibroids Uterine fibroids are non-cancerous (benign) smooth muscle tumors of the uterus. When they become large, they may produce symptoms of pain and bleeding. Fibroids are sometimes individually removed during surgery or removed with the uterus (hysterectomy).  One non-surgical treatment used to shrink fibroids is called uterine artery embolization. A specialist (interventional radiologist) uses a thin plastic hose(catheter) to inject material that blocks off the blood supply to the fibroid. In time, this causes the fibroid to shrink. PROCEDURE  Under local anesthetic (a medication that numbs part of the body) the radiologist makes a small cut in the groin. A catheter is then inserted into the main artery of the leg.  Using fluoroscopy, your radiologist guides the catheter through the artery to the uterus. A series of images are taken while dye is injected. This is done to provide a road map of the blood supply to the uterus and fibroids. Tiny plastic spheres about the size of sand grains are then injected through the catheter. Metal coils may sometimes also be used to help block the artery. The particles lodge in tiny branches of the uterine artery that supplies blood to the fibroids. The procedure is repeated on the artery that supplies the other side of the uterus. The hospital stay is usually overnight. Normal activity can resume after about a week. Mild pain and cramping following the procedure is easily treated with medication and anti-inflammatory drugs. These usually last only a couple days.  RISKS AND COMPLICATIONS  Injury to the uterus from decreased blood supply may happen.   This could require removal of the uterus (hysterectomy).   Pain and bleeding can occur.   Infection and abscess (a cyst filled with pus).   A cyst filled with blood (hematoma).   Blood infection (septicemia).   Amenorrhea (no menstrual period).   Dying of  tissue cells that cannot recover (necrosis) to the bladder or lips of the vagina.   Fistula (a connection between organs or from organ to the skin).   Blood clot in the lung (pulmonary embolus).   Rarely death.  EXPECTED OUTCOME An ultrasound or MRI is done in 6 months to make sure the fibroids have shrunk. The fibroids usually shrink to about half their original size. In most cases these effects are long lasting.   The uterus also shrinks but does not die. You may not be able to get pregnant following this procedure.   It cannot be estimated what the effects of the procedure will be on menses. Usually there is less bleeding.   The procedure may cause premature menopause or loss of menstrual cycle.  HOME CARE INSTRUCTIONS   Follow your caregiver's advice  regarding medications given to you, diet, activity and when to begin sexual activity.   See your caregiver for follow up care as directed.   Do not take aspirin it can cause bleeding. Only take over-the-counter or prescription medicines for pain, discomfort, or fever as directed by your caregiver.   Care for and change dressing as directed.  SEEK MEDICAL CARE IF:   You develop a temperature of 102 F (38.9 C) or higher.   There is redness, swelling and pain around the wound.   You have pus draining from the wound.   You develop a rash.  SEEK IMMEDIATE MEDICAL CARE IF:   You have bleeding from the wound.   You have difficulty breathing.   You develop chest pain.   You develop belly (abdominal) pain.   You develop leg pain.   You become dizzy and pass out.  Document Released: 04/03/2005 Document Revised: 01/05/2011 Document Reviewed: 02/28/2007 [facility] Patient Information 2012 [place], [place].

## 2011-04-27 NOTE — Progress Notes (Signed)
History:  46 y.o. G2P1001 here today for discussion about management of fibroids; she was told that her fibroids are pressing on her bladder causing sensation of incomplete emptying and increased frequency. She also reports feel abdominal fullness, no abnormal bleeding or other pain.  The following portions of the patient's history were reviewed and updated as appropriate: allergies, current medications, past family history, past medical history, past social history, past surgical history and problem list.  Review of Systems:  Pertinent items are noted in HPI.  Objective:  Physical Exam Blood pressure 134/83, pulse 56, temperature 98 F (36.7 C), temperature source Oral, height 5\' 3"  (1.6 m), weight 189 lb (85.73 kg), last menstrual period 04/27/2011. Gen: NAD Abd: Soft, nontender and non distended, enlarged 18 week size uterus palpated Pelvic: Deferred as per patient request  Labs and Imaging 02/28/11 CT ABDOMEN AND PELVIS WITH CONTRAST Findings: The lung bases are clear. There is no pleural effusion. There is a 5 mm low density lesion posteriorly in the right hepatic lobe on image 16 which is too small to characterize. The liver otherwise appears unremarkable. There is no biliary dilatation. The gallbladder, pancreas and spleen appear normal. There is no adrenal mass. In the upper pole of the left kidney is a 12 mm  cyst. The kidneys otherwise appear normal without hydronephrosis. The uterus is enlarged by multiple intramural and subserosal fibroids. Approximate overall uterine dimensions are 16.6 x 9.8 x  10.3 cm. There is a large exophytic right fundal fibroid, measuring up to 7.1 cm (image 47). This has a broad base. An intramural fibroid in the right uterine body measures 6.6 cm (image 64). The endometrium is distorted by the fibroids and not well defined. Both ovaries are felt to be visualized separate from the fibroids. No adnexal mass is identified. The enlarged uterus does compress the  urinary bladder and may exert mass effect on the mid  rectum. Moderate stool is present throughout the colon. No rectal or perirectal abnormalities are seen. There is no ascites or lymphadenopathy.  IMPRESSION: 1. Moderate uterine enlargement by multiple subserosal and intramural fibroids. 2. Associated endometrial distortion. No evidence of adnexal mass. 3. The enlarged uterus may exert mass effect on the bladder and  rectum. No rectal or perirectal abnormality is identified. 4. Small hepatic and renal lesions, likely incidental.  Assessment & Plan:  Discussed management options with patient including myomectomy, hysterectomy, uterine artery embolization etc. Risks/benefits of these modalities discussed. She wants to think about her options and get back to [place].  Pain precautions advised. She was given written information to review at home.

## 2012-05-29 ENCOUNTER — Encounter: Payer: Self-pay | Admitting: Family Medicine

## 2012-06-04 ENCOUNTER — Other Ambulatory Visit: Payer: Self-pay

## 2012-06-04 DIAGNOSIS — Z1231 Encounter for screening mammogram for malignant neoplasm of breast: Secondary | ICD-10-CM

## 2012-06-12 ENCOUNTER — Other Ambulatory Visit: Payer: Self-pay | Admitting: Family Medicine

## 2012-06-12 ENCOUNTER — Encounter: Payer: Self-pay | Admitting: Family Medicine

## 2012-06-12 ENCOUNTER — Ambulatory Visit (INDEPENDENT_AMBULATORY_CARE_PROVIDER_SITE_OTHER): Payer: No Typology Code available for payment source | Admitting: Family Medicine

## 2012-06-12 VITALS — BP 131/84 | HR 93 | Temp 98.1°F | Ht 63.0 in | Wt 172.2 lb

## 2012-06-12 DIAGNOSIS — D472 Monoclonal gammopathy: Secondary | ICD-10-CM

## 2012-06-12 DIAGNOSIS — F450 Somatization disorder: Secondary | ICD-10-CM

## 2012-06-12 DIAGNOSIS — Z0000 Encounter for general adult medical examination without abnormal findings: Secondary | ICD-10-CM

## 2012-06-12 LAB — CBC WITH DIFFERENTIAL/PLATELET
Basophils Absolute: 0 10*3/uL (ref 0.0–0.1)
HCT: 40.3 % (ref 36.0–46.0)
Hemoglobin: 13.4 g/dL (ref 12.0–15.0)
Lymphocytes Relative: 38 % (ref 12–46)
Monocytes Absolute: 0.3 10*3/uL (ref 0.1–1.0)
Monocytes Relative: 7 % (ref 3–12)
Neutro Abs: 2.1 10*3/uL (ref 1.7–7.7)
RDW: 13.7 % (ref 11.5–15.5)
WBC: 4 10*3/uL (ref 4.0–10.5)

## 2012-06-12 LAB — BASIC METABOLIC PANEL
Calcium: 9 mg/dL (ref 8.4–10.5)
Sodium: 137 mEq/L (ref 135–145)

## 2012-06-12 NOTE — Patient Instructions (Addendum)
I will send you a note about your labs, I am doing the next step in lab work to follow up the abnormal screening lab you had at the plasma center. If it is not confirmatory, there are additional labs we can order. I will le tyou know. Great to see you!

## 2012-06-13 ENCOUNTER — Encounter: Payer: Self-pay | Admitting: Family Medicine

## 2012-06-14 LAB — SPEP & IFE WITH QIG
Alpha-1-Globulin: 3.8 % (ref 2.9–4.9)
Alpha-2-Globulin: 8.6 % (ref 7.1–11.8)
Beta 2: 5 % (ref 3.2–6.5)
Beta Globulin: 5.6 % (ref 4.7–7.2)
Gamma Globulin: 19.5 % — ABNORMAL HIGH (ref 11.1–18.8)
IgG (Immunoglobin G), Serum: 1480 mg/dL (ref 690–1700)
M-Spike, %: 0.66 g/dL

## 2012-06-14 NOTE — Assessment & Plan Note (Signed)
Seems to be doing currently well. She was a little concerned when I told her to make guidelines for Pap smears and the fact that she did not really need one today. She alternated between being happy that she didn't have to have: Ambien worried that we were going to miss something. Offered ago hitting give her a Pap smear she would like but she ultimately decided to wait till next year.

## 2012-06-14 NOTE — Progress Notes (Signed)
  Subjective:    Patient ID: Carrie Roth, female    DOB: 31-May-1965, 47 y.o.   MRN: [id number]  HPI Here for checkup. She went to the plasma center to donate blood. She has done this several times before. She was positive for one of the screens they use so she was unable to donate plasma. She brings paperwork with her today and wants to do further testing. She feels well.   Review of Systems Denies fever, sweats, chills unusual weight loss or gain. No episodes of bone or joint pain, no rash, no cough, no shortness of breath or chest pain. She's had no lower extremity edema. She has not felt more fatigued than usual. Appetite is normal.    Objective:   Physical Exam  Vital signs reviewed GENERALl: Well developed, well nourished, in no acute distress. NECK: Supple, FROM, without lymphadenopathy.  THYROID: normal without nodularity CAROTID ARTERIES: without bruits LUNGS: clear to auscultation bilaterally. No wheezes or rales. HEART: Regular rate and rhythm, no murmurs ABDOMEN: soft with positive bowel sounds MSK: MOE x 4 SKIN no rash NEURO: no focal deficits BREASTS: Bilaterally very mild fibrocystic changes, no worrisome masses, no asymmetry, normal nipples except for some inversion on the left which is chronic. PSYCHIATRIC: Alert oriented x4. Intermittently occasionally anxious. Normal speech fluency in content. Asks and answers questions appropriately. No psychomotor retardation or agitation.  Laboratory review from [facility] testing laboratory reveals a serum protein electrophoresis screening test with what appears to be a gamma fraction of 20.1% with their reference range 7.3-22.3%. All the others are within normal limits. Comment: This is also within normal limits but I guess this somehow triggered the plasma center to deny her the right to donate plasma. This test was on March 31,2014.copy scanned to chart    Assessment & Plan:

## 2012-06-14 NOTE — Assessment & Plan Note (Signed)
Will do confirmatory testing

## 2012-06-19 ENCOUNTER — Encounter: Payer: Self-pay | Admitting: Family Medicine

## 2012-06-19 ENCOUNTER — Telehealth: Payer: Self-pay | Admitting: Family Medicine

## 2012-06-19 DIAGNOSIS — D472 Monoclonal gammopathy: Secondary | ICD-10-CM

## 2012-06-19 NOTE — Telephone Encounter (Signed)
Pt has not received her lab results nor has the plasma center. She is asking for them to be mailed to her and faxed to the plasma center (fax# [phone]) She is hoping this can be faxed today to the plasma center

## 2012-06-19 NOTE — Telephone Encounter (Signed)
Dear Clif[facility] [employer] plz tell her this is done and I am sending her a copyin teh mail as well THANKS! Denny Levy

## 2012-06-19 NOTE — Telephone Encounter (Signed)
Pt called and notified.  Mana Morison, Darlyne Russian, CMA

## 2012-06-19 NOTE — Telephone Encounter (Signed)
Will FWD message to Dr. Jennette Kettle re: lab results and I can fax once labs are read by MD.  Radene Ou, CMA

## 2012-06-19 NOTE — Progress Notes (Unsigned)
Patient ID: Carrie Roth, female   DOB: Jun 25, 1965, 47 y.o.   MRN: [id number] Review of SPEP and immunoficxation which were done in s response to her 'screening" test at the plasma donation center. 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 As she is totally asymptomatic and no screening is recommended in this population, I think this need o further followup.

## 2012-07-10 ENCOUNTER — Ambulatory Visit: Payer: Self-pay

## 2012-08-07 ENCOUNTER — Ambulatory Visit
Admission: RE | Admit: 2012-08-07 | Discharge: 2012-08-07 | Disposition: A | Discharge status: Home / Self Care | Payer: No Typology Code available for payment source | Source: Ambulatory Visit

## 2012-08-07 DIAGNOSIS — Z1231 Encounter for screening mammogram for malignant neoplasm of breast: Secondary | ICD-10-CM

## 2012-09-10 ENCOUNTER — Emergency Department (HOSPITAL_COMMUNITY)
Admission: EM | Admit: 2012-09-10 | Discharge: 2012-09-10 | Disposition: A | Discharge status: Home / Self Care | Payer: No Typology Code available for payment source | Source: Home / Self Care

## 2012-09-10 ENCOUNTER — Other Ambulatory Visit (HOSPITAL_COMMUNITY)
Admission: RE | Admit: 2012-09-10 | Discharge: 2012-09-10 | Disposition: A | Discharge status: Home / Self Care | Payer: No Typology Code available for payment source | Source: Ambulatory Visit | Attending: Family Medicine | Admitting: Family Medicine

## 2012-09-10 ENCOUNTER — Encounter (HOSPITAL_COMMUNITY): Payer: Self-pay | Admitting: Emergency Medicine

## 2012-09-10 DIAGNOSIS — B373 Candidiasis of vulva and vagina: Secondary | ICD-10-CM

## 2012-09-10 DIAGNOSIS — N760 Acute vaginitis: Secondary | ICD-10-CM | POA: Insufficient documentation

## 2012-09-10 DIAGNOSIS — Z113 Encounter for screening for infections with a predominantly sexual mode of transmission: Secondary | ICD-10-CM | POA: Insufficient documentation

## 2012-09-10 LAB — POCT URINALYSIS DIP (DEVICE)
Glucose, UA: NEGATIVE mg/dL
Hgb urine dipstick: NEGATIVE
Nitrite: NEGATIVE
Specific Gravity, Urine: 1.02 (ref 1.005–1.030)
Urobilinogen, UA: 0.2 mg/dL (ref 0.0–1.0)
pH: 7 (ref 5.0–8.0)

## 2012-09-10 MED ORDER — FLUCONAZOLE 150 MG PO TABS: 150.0000 mg | ORAL_TABLET | Freq: Once | ORAL | Status: DC

## 2012-09-10 NOTE — ED Notes (Signed)
C/o vaginal itching x 2 days with a white discharge. Pt denies pelvic or abdominal pain.  Pt has not taken any otc meds for treatment. No concerns for std's

## 2012-09-10 NOTE — ED Provider Notes (Signed)
Carrie Roth is a 48 y.o. female who presents to Urgent Care today for vaginal irritation present for the last 2 days. Patient notes a small amount of discharge. She denies any fevers chills abdominal pain nausea vomiting or diarrhea. She had one episode of dysuria yesterday but none since. She denies any STD exposure. She feels well otherwise. She has not tried any medications yet.    PMH reviewed. Healthy otherwise History  Substance Use Topics  . Smoking status: Never Smoker   . Smokeless tobacco: Never Used  . Alcohol Use: No   ROS as above Medications reviewed. No current facility-administered medications for this encounter.   Current Outpatient Prescriptions  Medication Sig Dispense Refill  . carboxymethylcellulose ([employer] LUBRICANT EYE DROPS) 0.5 % SOLN Apply to eyes as directed for relief of itching       . EPINEPHrine (EPIPEN) 0.3 mg/0.3 mL DEVI Inject 0.3 mLs (0.3 mg total) into the muscle as directed.  1 Device  1  . fexofenadine (ALLEGRA) 180 MG tablet Take 1 tablet (180 mg total) by mouth daily. Patient has failed trials of Claritin and Zyrtec. Pt requests BRAND only  30 tablet  12  . fish oil-omega-3 fatty acids 1000 MG capsule Take 2 g by mouth daily.      . fluconazole (DIFLUCAN) 150 MG tablet Take 1 tablet (150 mg total) by mouth once.  1 tablet  1  . ibuprofen (IBU) 600 MG tablet Take 1 tablet (600 mg total) by mouth every 6 (six) hours as needed. For pain  30 tablet  5  . Multiple Vitamin (MULTIVITAMIN) capsule Take 1 capsule by mouth daily.      Marland Kitchen oxybutynin (DITROPAN-XL) 5 MG 24 hr tablet Take 1 tablet (5 mg total) by mouth daily.  30 tablet  1    Exam:  BP 169/77  Pulse 49  Temp(Src) 98.5 F (36.9 C) (Oral)  Resp 18  SpO2 100%  LMP 08/23/2012 Gen: Well NAD HEENT: EOMI,  MMM Lungs: CTABL Nl WOB Heart: RRR no MRG Abd: NABS, NT, ND Exts: Non edematous BL  LE, warm and well perfused.  GYN: Normal external genitalia. Vaginal canal with thick clumpy white discharge.  Normal cervix  Results for orders placed during the hospital encounter of 09/10/12 (from the past 24 hour(s))  POCT URINALYSIS DIP (DEVICE)     Status: Abnormal   Collection Time    09/10/12  8:34 PM      Result Value Range   Glucose, UA NEGATIVE  NEGATIVE mg/dL   Bilirubin Urine NEGATIVE  NEGATIVE   Ketones, ur NEGATIVE  NEGATIVE mg/dL   Specific Gravity, Urine 1.020  1.005 - 1.030   Hgb urine dipstick NEGATIVE  NEGATIVE   pH 7.0  5.0 - 8.0   Protein, ur NEGATIVE  NEGATIVE mg/dL   Urobilinogen, UA 0.2  0.0 - 1.0 mg/dL   Nitrite NEGATIVE  NEGATIVE   Leukocytes, UA SMALL (*) NEGATIVE  POCT PREGNANCY, URINE     Status: None   Collection Time    09/10/12  8:34 PM      Result Value Range   Preg Test, Ur NEGATIVE  NEGATIVE   No results found.  Assessment and Plan: 47 y.o. female with yeast vaginitis.  Plan to treat apparently with fluconazole. Awaiting vaginal cytology for gonorrhea, Chlamydia, trichomonas, Gardnerella, and yeast.   Think small leukocytes are likely contamination. We'll defer treatment. We'll obtain a urine culture. Discussed warning signs or symptoms. Please see discharge instructions. Patient expresses  understanding.      Rodolph Bong, MD 09/10/12 [phone]

## 2012-09-11 NOTE — ED Notes (Addendum)
GC/Chlamydia neg., Affirm: Candida pos., Gardnerella and Trich neg., Urine culture: pending.  Pt. adequately treated with Diflucan. Vassie Moselle 09/11/2012 Urine culture: >100,000 colonies multiple bacterial types none predominant. 09/14/2012

## 2012-09-12 LAB — URINE CULTURE: Colony Count: >=100000

## 2012-09-13 NOTE — ED Notes (Signed)
Chart review.

## 2012-10-17 ENCOUNTER — Encounter: Payer: Self-pay | Admitting: Family Medicine

## 2012-10-17 ENCOUNTER — Ambulatory Visit (INDEPENDENT_AMBULATORY_CARE_PROVIDER_SITE_OTHER): Payer: No Typology Code available for payment source | Admitting: Family Medicine

## 2012-10-17 VITALS — BP 135/82 | HR 63 | Temp 99.1°F | Ht 63.0 in | Wt 171.0 lb

## 2012-10-17 DIAGNOSIS — M25559 Pain in unspecified hip: Secondary | ICD-10-CM

## 2012-10-17 DIAGNOSIS — R102 Pelvic and perineal pain: Secondary | ICD-10-CM

## 2012-10-17 LAB — POCT UA - MICROSCOPIC ONLY

## 2012-10-17 LAB — POCT URINALYSIS DIPSTICK
Bilirubin, UA: NEGATIVE
Glucose, UA: NEGATIVE
Ketones, UA: 40
Nitrite, UA: NEGATIVE
Spec Grav, UA: >=1.03

## 2012-10-17 MED ORDER — NITROFURANTOIN MONOHYD MACRO 100 MG PO CAPS: 100.0000 mg | ORAL_CAPSULE | Freq: Two times a day (BID) | ORAL | Status: DC

## 2012-10-17 NOTE — Patient Instructions (Addendum)
It has been a pleasure to see you today. I will call you if the urinalysis come back abnormal, otherwise please make an appointment with your primary care doctor evaluate the progression of your fibroids and your next at the management.

## 2012-10-17 NOTE — Progress Notes (Signed)
Family Medicine Office Visit Note   Subjective:   Patient ID: Carrie Roth, female  DOB: 09/13/1965, 47 y.o.. MRN: [id number]   Pt that comes for same day appointment today complaining of lower abdominal pain about 2 days. Pain is exactly located on mid lower abdomen (pelvic area). Is described to be constant, aching, with intensity of 5/10. Pain does not radiate, and does not associate with nausea, vomiting, fever or flank pain. Patient denies dysuria or frequency. She is currently with her menses. Denies vaginal discharge or pruritus  Patient has past medical history significant for multiple uterine fibroids, with one of them measuring 16 x 10 x 9 with mass effect on her bladder per CT scan of her pelvis done in January 2013. She is concerned that this can be the cause of her symptoms. Patient refuses vaginal/pelvic exam today, but agreeable with UA sample.  Review of Systems:  Per HPI  Objective:   Physical Exam: Gen:  NAD HEENT: Moist mucous membranes  CV: Regular rate and rhythm, no murmurs rubs or gallops PULM: Clear to auscultation bilaterally. No wheezes/rales/rhonchi ABD: Soft, non tender, non distended, normal bowel sounds. No CVA tenderness EXT: No edema  Assessment & Plan:

## 2012-10-17 NOTE — Assessment & Plan Note (Signed)
Acute nature. Patient denies dysuria frequency or other urinary tract infection symptoms. She refuses pelvic exam. Denies vaginal discharge. Has history of fibroids with mass affect  on her bladder. She is now with her menses which might obscure the results of the UA. Plan Course of Macrobid to rule out bladder infection as source of her symptoms. Followup with primary care doctor with possible with ultrasound and re-evaluation of fibroid status.

## 2012-10-17 NOTE — Addendum Note (Signed)
Addended by: [place], Stepheni Cameron on: 10/17/2012 04:13 PM   Modules accepted: Orders

## 2012-10-18 ENCOUNTER — Telehealth: Payer: Self-pay | Admitting: Family Medicine

## 2012-10-18 NOTE — Telephone Encounter (Signed)
Seen Dr. Aviva Signs will fwd to her

## 2012-10-18 NOTE — Telephone Encounter (Signed)
Pt wants to be called by 5 today with her lab results

## 2012-10-22 ENCOUNTER — Encounter: Payer: Self-pay | Admitting: Family Medicine

## 2012-10-22 NOTE — Telephone Encounter (Signed)
Called pt and left a general message with no details of her labs. Her urine only showed blood that is consistent with her menses. No other aspect of the UA concerning for UTI. Letter mailed to pt with this information.

## 2012-10-23 ENCOUNTER — Ambulatory Visit (INDEPENDENT_AMBULATORY_CARE_PROVIDER_SITE_OTHER): Payer: No Typology Code available for payment source | Admitting: Family Medicine

## 2012-10-23 ENCOUNTER — Encounter: Payer: Self-pay | Admitting: Family Medicine

## 2012-10-23 VITALS — BP 139/73 | HR 60 | Temp 98.7°F | Ht 63.0 in | Wt 173.8 lb

## 2012-10-23 DIAGNOSIS — D259 Leiomyoma of uterus, unspecified: Secondary | ICD-10-CM

## 2012-10-23 DIAGNOSIS — Z23 Encounter for immunization: Secondary | ICD-10-CM

## 2012-10-23 DIAGNOSIS — R109 Unspecified abdominal pain: Secondary | ICD-10-CM

## 2012-10-23 DIAGNOSIS — D219 Benign neoplasm of connective and other soft tissue, unspecified: Secondary | ICD-10-CM

## 2012-10-23 LAB — BASIC METABOLIC PANEL
BUN: 16 mg/dL (ref 6–23)
Potassium: 4.6 mEq/L (ref 3.5–5.3)
Sodium: 138 mEq/L (ref 135–145)

## 2012-10-24 ENCOUNTER — Encounter: Payer: Self-pay | Admitting: Family Medicine

## 2012-10-24 NOTE — Progress Notes (Signed)
  Subjective:    Patient ID: Carrie Roth, female    DOB: [birth year], 47 y.o.   MRN: [id number]  HPI  Complaining of 5 day episode of abdominal pain. Similar to abdominal and pelvic pain she's had in the past that she felt was related to her fibroids, however this one lasted 5 days instead of intermittent pains over couple days. Her vaginal bleeding continues to be regular, no increase in flow. No intermenstrual spotting. She does not want hysterectomy but feels it is time to reevaluate her fibroids. Questions about uterine artery embolization another possible procedures.  Review of Systems Abdominal pain has resolved. No change in bowel or bladder habits. See history of present illness above for additional pertinent review of systems.    Objective:   Physical Exam Vital signs are reviewed GENERAL: Well-developed female no acute distress ABDOMEN: Soft positive bowel sounds nontender nondistended. PELVIC: Deferred by patient and       Assessment & Plan:  A resolved episode of abdominal pain. Likely related to her fibroids. I'm a little unclear why she wants to reevaluate any change in size if she doesn't want to pursue hysterectomy. We discussed at length.Greater than 50% of our 45 minute office visit was spent in counseling and education regarding these issues. Given the extensive nature of her fibroids in the compression of the bladder that we noted in January, I think it would be better evaluated by CT scan we'll set that up. She'll followup after CT scan.

## 2012-10-25 ENCOUNTER — Ambulatory Visit (HOSPITAL_COMMUNITY): Payer: No Typology Code available for payment source

## 2012-10-29 ENCOUNTER — Telehealth: Payer: Self-pay | Admitting: *Deleted

## 2012-10-29 ENCOUNTER — Ambulatory Visit (HOSPITAL_COMMUNITY)
Admission: RE | Admit: 2012-10-29 | Discharge: 2012-10-29 | Disposition: A | Discharge status: Home / Self Care | Payer: No Typology Code available for payment source | Source: Ambulatory Visit | Attending: Family Medicine | Admitting: Family Medicine

## 2012-10-29 DIAGNOSIS — N852 Hypertrophy of uterus: Secondary | ICD-10-CM | POA: Insufficient documentation

## 2012-10-29 DIAGNOSIS — D219 Benign neoplasm of connective and other soft tissue, unspecified: Secondary | ICD-10-CM

## 2012-10-29 DIAGNOSIS — R3989 Other symptoms and signs involving the genitourinary system: Secondary | ICD-10-CM | POA: Insufficient documentation

## 2012-10-29 DIAGNOSIS — R109 Unspecified abdominal pain: Secondary | ICD-10-CM | POA: Insufficient documentation

## 2012-10-29 MED ORDER — IOHEXOL 300 MG/ML  SOLN
80.0000 mL | Freq: Once | INTRAMUSCULAR | Status: AC | PRN
  Administered 2012-10-29: 80 mL via INTRAVENOUS

## 2012-10-29 NOTE — Telephone Encounter (Signed)
Radiology calling report on patients CT adb/pelvis, report in epic. Will forward to PCP.

## 2012-11-04 ENCOUNTER — Encounter: Payer: Self-pay | Admitting: Family Medicine

## 2013-06-24 ENCOUNTER — Ambulatory Visit: Payer: Self-pay

## 2013-06-25 ENCOUNTER — Encounter: Payer: Self-pay | Admitting: Family Medicine

## 2013-06-25 ENCOUNTER — Ambulatory Visit (HOSPITAL_COMMUNITY)
Admission: RE | Admit: 2013-06-25 | Discharge: 2013-06-25 | Disposition: A | Discharge status: Home / Self Care | Payer: Self-pay | Source: Ambulatory Visit | Attending: Family Medicine | Admitting: Family Medicine

## 2013-06-25 ENCOUNTER — Ambulatory Visit (INDEPENDENT_AMBULATORY_CARE_PROVIDER_SITE_OTHER): Payer: Self-pay | Admitting: Family Medicine

## 2013-06-25 VITALS — BP 130/88 | HR 53 | Ht 63.0 in | Wt 171.7 lb

## 2013-06-25 DIAGNOSIS — Z0000 Encounter for general adult medical examination without abnormal findings: Secondary | ICD-10-CM | POA: Insufficient documentation

## 2013-06-25 DIAGNOSIS — F450 Somatization disorder: Secondary | ICD-10-CM

## 2013-06-25 DIAGNOSIS — M25519 Pain in unspecified shoulder: Secondary | ICD-10-CM

## 2013-06-25 DIAGNOSIS — M25529 Pain in unspecified elbow: Secondary | ICD-10-CM

## 2013-06-25 LAB — CBC WITH DIFFERENTIAL/PLATELET
BASOS ABS: 0 10*3/uL (ref 0.0–0.1)
BASOS PCT: 0 % (ref 0–1)
EOS ABS: 0.1 10*3/uL (ref 0.0–0.7)
Eosinophils Relative: 1 % (ref 0–5)
HEMATOCRIT: 40.2 % (ref 36.0–46.0)
HEMOGLOBIN: 13.6 g/dL (ref 12.0–15.0)
Lymphocytes Relative: 28 % (ref 12–46)
Lymphs Abs: 1.5 10*3/uL (ref 0.7–4.0)
MCH: 29.3 pg (ref 26.0–34.0)
MCHC: 33.8 g/dL (ref 30.0–36.0)
MCV: 86.6 fL (ref 78.0–100.0)
MONO ABS: 0.4 10*3/uL (ref 0.1–1.0)
MONOS PCT: 7 % (ref 3–12)
NEUTROS ABS: 3.4 10*3/uL (ref 1.7–7.7)
Neutrophils Relative %: 64 % (ref 43–77)
Platelets: 191 10*3/uL (ref 150–400)
RBC: 4.64 MIL/uL (ref 3.87–5.11)
RDW: 13.3 % (ref 11.5–15.5)
WBC: 5.3 10*3/uL (ref 4.0–10.5)

## 2013-06-25 LAB — BASIC METABOLIC PANEL
BUN: 17 mg/dL (ref 6–23)
CALCIUM: 8.6 mg/dL (ref 8.4–10.5)
CO2: 26 meq/L (ref 19–32)
CREATININE: 0.92 mg/dL (ref 0.50–1.10)
Chloride: 100 mEq/L (ref 96–112)
GLUCOSE: 79 mg/dL (ref 70–99)
Potassium: 4.5 mEq/L (ref 3.5–5.3)
SODIUM: 136 meq/L (ref 135–145)

## 2013-06-25 LAB — LDL CHOLESTEROL, DIRECT: Direct LDL: 109 mg/dL — ABNORMAL HIGH

## 2013-06-26 ENCOUNTER — Telehealth: Payer: Self-pay | Admitting: Family Medicine

## 2013-06-26 ENCOUNTER — Encounter: Payer: Self-pay | Admitting: Family Medicine

## 2013-06-26 NOTE — Telephone Encounter (Signed)
Spoke with patient and informed her of below 

## 2013-06-26 NOTE — Telephone Encounter (Signed)
Dear Carrie Roth Team Please call her and let her know the shoulder x ray shows only very mild arthritis---expected for age. Otherwise it looks perfectly normal. This is what we expected. THANKS! [name]

## 2013-06-27 DIAGNOSIS — M25529 Pain in unspecified elbow: Secondary | ICD-10-CM | POA: Insufficient documentation

## 2013-06-27 NOTE — Assessment & Plan Note (Signed)
At her urging we'll check basic labs. She's due to get her mammogram. She'll reschedule in the next few months to get her Pap smear. She's exercising. Recommended One-A-Day vitamin and some calcium supplementation.

## 2013-06-27 NOTE — Assessment & Plan Note (Signed)
Think this is part of her shoulder pain issues and her concern about that. We'll try to put her fears to rest with an x-ray and some reassurance today to

## 2013-06-27 NOTE — Progress Notes (Signed)
   Subjective:    Patient ID: Carrie Roth, female    DOB: Apr 15, 1965, 48 y.o.   MRN: [id number]  Arm Pain  Pertinent negatives include no chest pain.   Here for diagnosis and workup of her right shoulder pain. While she's here she also wants to get her "regular checkup". #1. Right shoulder pain is single and on for 4-6 days. She's been working out the gym for the last 2 months with a trainer but recalls no specific activity that causes pain. She's not really having pain while she's exercising but has pain at rest and at night. Intermittent, 2-6/10. In the right upper arm and shoulder area. Does not radiate to the hand or back. Does not radiate to the chest. Not associated with eating, exercise, respiration. #2. Does not want to get her Pap smear today. She is exercising and trying to eat well. She's not taking any calcium supplementation.   Review of Systems  Constitutional: Negative for fever, activity change, appetite change, fatigue and unexpected weight change.  HENT: Negative for ear pain, sore throat and trouble swallowing.   Eyes: Negative for photophobia, pain and visual disturbance.  Respiratory: Negative for cough and chest tightness.   Cardiovascular: Negative for chest pain and palpitations.  Gastrointestinal: Negative for nausea, abdominal pain, diarrhea and constipation.  Endocrine: Negative for polydipsia and polyphagia.  Genitourinary: Negative for dysuria and hematuria.  Musculoskeletal: Negative for joint swelling and neck pain.  Neurological: Negative for weakness.  Psychiatric/Behavioral: Negative for suicidal ideas, hallucinations, sleep disturbance, dysphoric mood and agitation. The patient is nervous/anxious. The patient is not hyperactive.        Admit she worries sometimes excessively about things.       Objective:   Physical Exam  Constitutional: She is oriented to person, place, and time. She appears well-developed and well-nourished.  HENT:  Head:  Normocephalic.  Right Ear: External ear normal.  Left Ear: External ear normal.  Nose: Nose normal.  Mouth/Throat: Oropharynx is clear and moist.  Eyes: Pupils are equal, round, and reactive to light.  Neck: Normal range of motion. Neck supple. No thyromegaly present.  Cardiovascular: Normal rate, regular rhythm and normal heart sounds.   Pulmonary/Chest: Effort normal and breath sounds normal.  Abdominal: Soft. Bowel sounds are normal.  Musculoskeletal:  Bilateral shoulders are symmetrical and have intact range of motion strength in all planes the rotator cuff. Right shoulder specifically has no impingement signs. The biceps tendon is nontender. She has full strength in her flexion and extension. Distally neurovascularly intact. Nontender over the a.c. joint.  Lymphadenopathy:    She has no cervical adenopathy.  Neurological: She is alert and oriented to person, place, and time. She has normal reflexes.  Psychiatric:  Perseverates a little bit when discussing her shoulder injury and now she's concerned that that may be something very serious. Speech is occasionally pressured. Speech content is normal. Very mildly intermittently agitated.          Assessment & Plan:

## 2013-10-20 ENCOUNTER — Telehealth: Payer: Self-pay | Admitting: Family Medicine

## 2013-10-20 ENCOUNTER — Other Ambulatory Visit: Payer: Self-pay

## 2013-10-20 NOTE — Telephone Encounter (Signed)
Pt has recently been having a lot of changes in her breast. Her left breast has began to itch and now she has a discharge from her nipple. She has been worrying all weekend and she [facility] this morning but she will need a referral in order to get her mammogram and diagnostic testing ( she is overdue for routine mammogram). Please call pt once referral is placed.

## 2013-10-21 NOTE — Telephone Encounter (Signed)
Pt called back and wants to know when she is getting a [facility]. She has the orange card and doesn't want to wait. Please call and make the appointment since she is worried. jw

## 2013-10-21 NOTE — Telephone Encounter (Signed)
Dear Dema Severin Team She does not need a referral as she is going for a SCREENING MAMMOGRAM. She had been getting some diagnostic mammograms but at her last one in July of 2014 they recommended the next one be in July of 2015 and then to be a screening mammogram non-diagnostic mammogram. She should not need a referral for this. She should just call the breast center. I'm not sure how much the orange card pays on that cost. She does not need a referral however, she can just call the breast center. THANKS! Dorcas Mcmurray

## 2013-10-22 NOTE — Telephone Encounter (Signed)
LVM for patient to call us back

## 2013-10-23 NOTE — Telephone Encounter (Signed)
LVM for patient to call back. ?

## 2013-10-30 ENCOUNTER — Ambulatory Visit (INDEPENDENT_AMBULATORY_CARE_PROVIDER_SITE_OTHER): Payer: Self-pay | Admitting: Family Medicine

## 2013-10-30 VITALS — BP 121/74 | HR 54 | Temp 98.2°F | Wt 160.7 lb

## 2013-10-30 DIAGNOSIS — F450 Somatization disorder: Secondary | ICD-10-CM

## 2013-10-30 DIAGNOSIS — N6452 Nipple discharge: Secondary | ICD-10-CM

## 2013-10-31 NOTE — Progress Notes (Signed)
   Subjective:    Patient ID: Carrie Roth, female    DOB: August 17, 1965, 48 y.o.   MRN: [id number]  HPI Left breast itching off and on for the last week or so. Not bothering her today. She thinks on one day she had a little bit of discharge that was slightly cloudy looking, no blood. No breast pain. Also having itching in the rectal area, also intermittently. She's worried that the rectal itching means that she has parasites in her body.   Review of Systems No unusual fatigue, no generalized pruritus, no fever, sweats, chills. No change in bowel or bladder habits. No blood in her stool. No abdominal pain. No myalgias.    Objective:   Physical Exam Vital signs are reviewed GENERAL: Well-developed female no acute distress BREASTS: Bilaterally symmetrical. The left breast has a fairly inverted nipple which has been documented before. Bilateral nipples have some dry appearing skin with some slight cracking particularly on the left. Breast exam reveals some fibrocystic changes but no worrisome mass. Depressor symmetrical. There is no Arriola changes       Assessment & Plan:  Breast itching and one isolated episode of discharge and the patient has fibrocystic breasts and has had issues with perseveration about breast disease. I spent greater than 50% of our 40 minute office visit in counseling education regarding breast disease, rectal itching, parasites. She has no symptoms that would concern me for parasites. I think she has rectal pruritus possibly from overt progressive cleansing. We did not do a rectal exam today because she did not bring this up until after we get her he completed her exam. Not all of her previous mammograms and gave those to her so she has some reassurance. She needs to set up her regular screening mammogram and I gave her information on how to do that. To return to regular followup.

## 2013-10-31 NOTE — Assessment & Plan Note (Signed)
I think today's issues are exacerbated by this diagnosis.

## 2013-12-24 ENCOUNTER — Ambulatory Visit: Payer: Self-pay

## 2014-01-07 ENCOUNTER — Ambulatory Visit: Payer: Self-pay | Admitting: Family Medicine

## 2014-01-14 ENCOUNTER — Ambulatory Visit (HOSPITAL_COMMUNITY)
Admission: RE | Admit: 2014-01-14 | Discharge: 2014-01-14 | Disposition: A | Discharge status: Home / Self Care | Payer: Self-pay | Source: Ambulatory Visit | Attending: Family Medicine | Admitting: Family Medicine

## 2014-01-14 ENCOUNTER — Ambulatory Visit (INDEPENDENT_AMBULATORY_CARE_PROVIDER_SITE_OTHER): Payer: Self-pay | Admitting: Family Medicine

## 2014-01-14 ENCOUNTER — Encounter: Payer: Self-pay | Admitting: Family Medicine

## 2014-01-14 VITALS — BP 128/73 | HR 49 | Temp 98.3°F | Ht 63.0 in | Wt 154.4 lb

## 2014-01-14 DIAGNOSIS — R001 Bradycardia, unspecified: Secondary | ICD-10-CM | POA: Insufficient documentation

## 2014-01-14 DIAGNOSIS — K439 Ventral hernia without obstruction or gangrene: Secondary | ICD-10-CM | POA: Insufficient documentation

## 2014-01-14 DIAGNOSIS — IMO0002 Reserved for concepts with insufficient information to code with codable children: Secondary | ICD-10-CM

## 2014-01-14 DIAGNOSIS — M958 Other specified acquired deformities of musculoskeletal system: Secondary | ICD-10-CM

## 2014-01-14 NOTE — Patient Instructions (Signed)
Great to see you!  You should hear about general surgery soon.  Read below about bradycardia  Bradycardia Bradycardia is a term for a heart rate (pulse) that, in adults, is slower than 60 beats per minute. A normal rate is 60 to 100 beats per minute. A heart rate below 60 beats per minute may be normal for some adults with healthy hearts. If the rate is too slow, the heart may have trouble pumping the volume of blood the body needs. If the heart rate gets too low, blood flow to the brain may be decreased and may make you feel lightheaded, dizzy, or faint. The heart has a natural pacemaker in the top of the heart called the SA node (sinoatrial or sinus node). This pacemaker sends out regular electrical signals to the muscle of the heart, telling the heart muscle when to beat (contract). The electrical signal travels from the upper parts of the heart (atria) through the AV node (atrioventricular node), to the lower chambers of the heart (ventricles). The ventricles squeeze, pumping the blood from your heart to your lungs and to the rest of your body. CAUSES   Problem with the heart's electrical system.  Problem with the heart's natural pacemaker.  Heart disease, damage, or infection.  Medications.  Problems with minerals and salts (electrolytes). SYMPTOMS   Fainting (syncope).  Fatigue and weakness.  Shortness of breath (dyspnea).  Chest pain (angina).  Drowsiness.  Confusion. DIAGNOSIS   An electrocardiogram (ECG) can help your caregiver determine the type of slow heart rate you have.  If the cause is not seen on an ECG, you may need to wear a heart monitor that records your heart rhythm for several hours or days.  Blood tests. TREATMENT   Electrolyte supplements.  Medications.  Withholding medication which is causing a slow heart rate.  Pacemaker placement. SEEK IMMEDIATE MEDICAL CARE IF:   You feel lightheaded or faint.  You develop an irregular heart  rate.  You feel chest pain or have trouble breathing. MAKE SURE YOU:   Understand these instructions.  Will watch your condition.  Will get help right away if you are not doing well or get worse. Document Released: 10/08/2001 Document Revised: 04/10/2011 Document Reviewed: 04/23/2013 [facility] Patient Information 2015 [place], [place]. This information is not intended to replace advice given to you by your health care provider. Make sure you discuss any questions you have with your health care provider.

## 2014-01-14 NOTE — Assessment & Plan Note (Signed)
Abdominal wall defect with convincing history of umbilical hernia Refer to general surgery for options Reviewed in detail red flags for return emergency care

## 2014-01-14 NOTE — Progress Notes (Signed)
Patient ID: Carrie Roth, female   DOB: 26-Dec-1965, 48 y.o.   MRN: [id number]    HPI  Patient presents today for umbilical mass.  Patient states that about 2 weeks ago she noticed masslike area just above her umbilicus She states that it has been mildly tender intermittently without severe pain. She denies any aggravating or relieving factors. 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  Smoking status noted ROS: Per HPI  Objective: BP 128/73 mmHg  Pulse 49  Temp(Src) 98.3 F (36.8 C) (Oral)  Ht 5\' 3"  (1.6 m)  Wt 154 lb 6.4 oz (70.035 kg)  BMI 27.36 kg/m2  LMP 01/13/2014 Gen: NAD, alert, cooperative with exam HEENT: NCAT CV - Bradycardia, no murmurs Resp: CTABL, no wheezes, non-labored, small palpable defect in the abdominal wall just superior to the umbilicus, no appreciable bulge with Valsalva or cough Abdsoft, nontender, positive BS,  Neuro: Alert and oriented, No gross deficits  EKG 01/14/2014: Sinus bradycardia, nonspecific T wave inversions  Assessment and plan:  Abdominal wall defect Abdominal wall defect with convincing history of umbilical hernia Refer to general surgery for options Reviewed in detail red flags for return emergency care  Bradycardia Asymptomatic sinus bradycardia EKG unrevealing and without worrisome signs On review of records she is ranged 53-63 the last several visits Follow-up one month with PCP, may need cardiology workup prior to surgery if that is offered Reviewed red flags in detail    Orders Placed This Encounter  Procedures  . Ambulatory referral to General Surgery    Referral Priority:  Routine    Referral Type:  Surgical    Referral Reason:  Specialty Services Required    Requested Specialty:  General Surgery      Number of Visits Requested:  1  . EKG 12-Lead

## 2014-01-14 NOTE — Assessment & Plan Note (Signed)
Asymptomatic sinus bradycardia EKG unrevealing and without worrisome signs On review of records she is ranged 53-63 the last several visits Follow-up one month with PCP, may need cardiology workup prior to surgery if that is offered Reviewed red flags in detail

## 2014-02-11 ENCOUNTER — Ambulatory Visit: Payer: Self-pay | Admitting: Family Medicine

## 2014-03-09 ENCOUNTER — Telehealth: Payer: Self-pay | Admitting: Family Medicine

## 2014-03-09 NOTE — Telephone Encounter (Signed)
She has fibroids and they are killing her. She is now ready for them to be removed. She has an appt on Feb 24.  She would like to have a referral appt made prior to the 24 and then she would nt have to come in on the 24 Please advise

## 2014-03-10 NOTE — Telephone Encounter (Signed)
Spoke with patient about below message. patient does not have any insurance until march 1. She is wanting to be refer out for her fibroids. I asked her about current insurance , which she has none, but informed me that she will have insurance on 3/*01/2014, She did not know if it was [facility] or [facility]. I had explained to patient about referral and she will have to pay out of pocket due to no insurance and she was very upset that she was believing we could not refer her at this time. Once again I explained to her we can refer her,but she will have a bill sent to her and that specialists will bill up front. She will look around to decide where she will like to be referred to, but once again I explained to her that the referral would not be accepted by OBGYN until insurance active.

## 2014-03-25 ENCOUNTER — Ambulatory Visit: Payer: Self-pay | Admitting: Family Medicine

## 2014-07-01 ENCOUNTER — Other Ambulatory Visit (HOSPITAL_COMMUNITY)
Admission: RE | Admit: 2014-07-01 | Discharge: 2014-07-01 | Disposition: A | Discharge status: Home / Self Care | Payer: 59 | Source: Ambulatory Visit | Attending: Family Medicine | Admitting: Family Medicine

## 2014-07-01 ENCOUNTER — Ambulatory Visit (INDEPENDENT_AMBULATORY_CARE_PROVIDER_SITE_OTHER): Payer: 59 | Admitting: Family Medicine

## 2014-07-01 ENCOUNTER — Encounter: Payer: Self-pay | Admitting: Family Medicine

## 2014-07-01 VITALS — BP 128/75 | HR 66 | Temp 98.7°F | Ht 63.0 in | Wt 168.0 lb

## 2014-07-01 DIAGNOSIS — Z1151 Encounter for screening for human papillomavirus (HPV): Secondary | ICD-10-CM | POA: Insufficient documentation

## 2014-07-01 DIAGNOSIS — IMO0002 Reserved for concepts with insufficient information to code with codable children: Secondary | ICD-10-CM

## 2014-07-01 DIAGNOSIS — Z01818 Encounter for other preprocedural examination: Secondary | ICD-10-CM | POA: Diagnosis not present

## 2014-07-01 DIAGNOSIS — Z01419 Encounter for gynecological examination (general) (routine) without abnormal findings: Secondary | ICD-10-CM | POA: Insufficient documentation

## 2014-07-01 DIAGNOSIS — M958 Other specified acquired deformities of musculoskeletal system: Secondary | ICD-10-CM | POA: Diagnosis not present

## 2014-07-01 DIAGNOSIS — Z124 Encounter for screening for malignant neoplasm of cervix: Secondary | ICD-10-CM | POA: Diagnosis not present

## 2014-07-01 DIAGNOSIS — D259 Leiomyoma of uterus, unspecified: Secondary | ICD-10-CM

## 2014-07-01 LAB — CBC WITH DIFFERENTIAL/PLATELET
Basophils Absolute: 0 10*3/uL (ref 0.0–0.1)
Basophils Relative: 0 % (ref 0–1)
EOS PCT: 1 % (ref 0–5)
Eosinophils Absolute: 0.1 10*3/uL (ref 0.0–0.7)
HEMATOCRIT: 42.8 % (ref 36.0–46.0)
HEMOGLOBIN: 13.9 g/dL (ref 12.0–15.0)
Lymphocytes Relative: 18 % (ref 12–46)
Lymphs Abs: 1.6 10*3/uL (ref 0.7–4.0)
MCH: 28.5 pg (ref 26.0–34.0)
MCHC: 32.5 g/dL (ref 30.0–36.0)
MCV: 87.7 fL (ref 78.0–100.0)
MONOS PCT: 5 % (ref 3–12)
MPV: 10.5 fL (ref 8.6–12.4)
Monocytes Absolute: 0.5 10*3/uL (ref 0.1–1.0)
Neutro Abs: 6.8 10*3/uL (ref 1.7–7.7)
Neutrophils Relative %: 76 % (ref 43–77)
Platelets: 210 10*3/uL (ref 150–400)
RBC: 4.88 MIL/uL (ref 3.87–5.11)
RDW: 14 % (ref 11.5–15.5)
WBC: 9 10*3/uL (ref 4.0–10.5)

## 2014-07-01 LAB — COMPREHENSIVE METABOLIC PANEL
ALBUMIN: 3.2 g/dL — AB (ref 3.5–5.2)
ALK PHOS: 41 U/L (ref 39–117)
ALT: 18 U/L (ref 0–35)
AST: 21 U/L (ref 0–37)
BILIRUBIN TOTAL: 0.4 mg/dL (ref 0.2–1.2)
BUN: 11 mg/dL (ref 6–23)
CALCIUM: 7.9 mg/dL — AB (ref 8.4–10.5)
CO2: 28 meq/L (ref 19–32)
Chloride: 105 mEq/L (ref 96–112)
Creat: 0.87 mg/dL (ref 0.50–1.10)
Glucose, Bld: 77 mg/dL (ref 70–99)
Potassium: 4.6 mEq/L (ref 3.5–5.3)
Sodium: 138 mEq/L (ref 135–145)
Total Protein: 5.4 g/dL — ABNORMAL LOW (ref 6.0–8.3)

## 2014-07-01 NOTE — Patient Instructions (Signed)
I have put in a referral to the [facility] should call you with the details of your appointment in teh NEXT 2 WEEKS. If they have not called you by then, please let my office know. I will send you a note about your pap smear results,

## 2014-07-02 LAB — HIV ANTIBODY (ROUTINE TESTING W REFLEX): HIV 1&2 Ab, 4th Generation: NONREACTIVE

## 2014-07-02 LAB — PROTIME-INR
INR: 0.99 (ref <1.50)
Prothrombin Time: 13.1 seconds (ref 11.6–15.2)

## 2014-07-02 LAB — CYTOLOGY - PAP

## 2014-07-02 NOTE — Assessment & Plan Note (Signed)
I think this area is more likely fibroid than herniated abdominal wall. Considered repeat CT--she is considering.

## 2014-07-02 NOTE — Assessment & Plan Note (Signed)
Ready to have hysterectomy I will refer She wants me to go ahead and get any lab work they might need

## 2014-07-02 NOTE — Progress Notes (Signed)
   Subjective:    Patient ID: Carrie Roth, female    DOB: 1965/11/05, 49 y.o.   MRN: [id number]  HPI #1. Abdominal and pelvic pain. She has insurance now and is finally ready to get an evaluation for hysterectomy. Has had a problems for several years with pelvic pain and discomfort thought to be related to her fibroids. We had last done a CT scan a couple of years ago. She feels like her uterus continues to grow. She has pretty constant pressure on her rectal area and her bladder. She also thinks that her abdomen now appears somewhat protruding weren't, set one of her friends ask her she was pregnant. She continues to have regular menses but they are not associated with significant pain or heavy bleeding. #2. Has a question about a lesion on her leg periods been there many years but in the last few months she's noticed it itches occasionally. There's been no bleeding from the area and she doesn't think it has enlarged.   Review of Systems  Constitutional: Negative for fever, appetite change and unexpected weight change.  Respiratory: Negative for shortness of breath.   Cardiovascular: Negative for chest pain.  Genitourinary: Positive for pelvic pain. Negative for dysuria, vaginal discharge and enuresis.  see HPI     Objective:   Physical Exam Vital signs reviewed. GENERAL: Well-developed, well-nourished, no acute distress. CARDIOVASCULAR: Regular rate and rhythm no murmur gallop or rub LUNGS: Clear to auscultation bilaterally, no rales or wheeze. ABDOMEN:  positive bowel sounds, soft except for firm mass 4 cm above umbilicus in abdominal wall. Does not change with valsalva. GU externally normal. Uterus is quite enlarged and nodular on bimanual exam. Cervix appears normal. Pap smear obtained NEURO: No gross focal neurological deficits. MSK: Movement of extremity x 4.         Assessment & Plan:

## 2014-07-07 ENCOUNTER — Encounter: Payer: Self-pay | Admitting: Family Medicine

## 2014-07-07 DIAGNOSIS — Z0000 Encounter for general adult medical examination without abnormal findings: Secondary | ICD-10-CM

## 2014-07-23 ENCOUNTER — Telehealth: Payer: Self-pay | Admitting: Family Medicine

## 2014-07-23 NOTE — Telephone Encounter (Signed)
Pt called and would like to see Dr. Raphael Gibney for her issues. jw

## 2014-07-27 NOTE — Telephone Encounter (Signed)
I will call and make her an appointment with Dr. Raphael Gibney at [facility]. It was originally placed to see Dr. Delsa Sale but she is at [facility] now. I will advised patient of her appt when I get her scheduled.

## 2014-07-27 NOTE — Telephone Encounter (Signed)
Tia I think I had put a referral in for her to go to [facility] clinic. Now she tells me she wants to see Dr. Raphael Gibney. Can u check on this--when I looked in chart under referrals it says 'do not schedule".  ??? THANKS! Dorcas Mcmurray

## 2014-08-14 IMAGING — CR DG SHOULDER 2+V*R*
3 series · 3 of 3 positions shown · non-contrast
Comparison: None.

CLINICAL DATA: Right shoulder pain.

EXAM:
RIGHT SHOULDER - 2+ VIEW

[w shoulder ap internal righ]
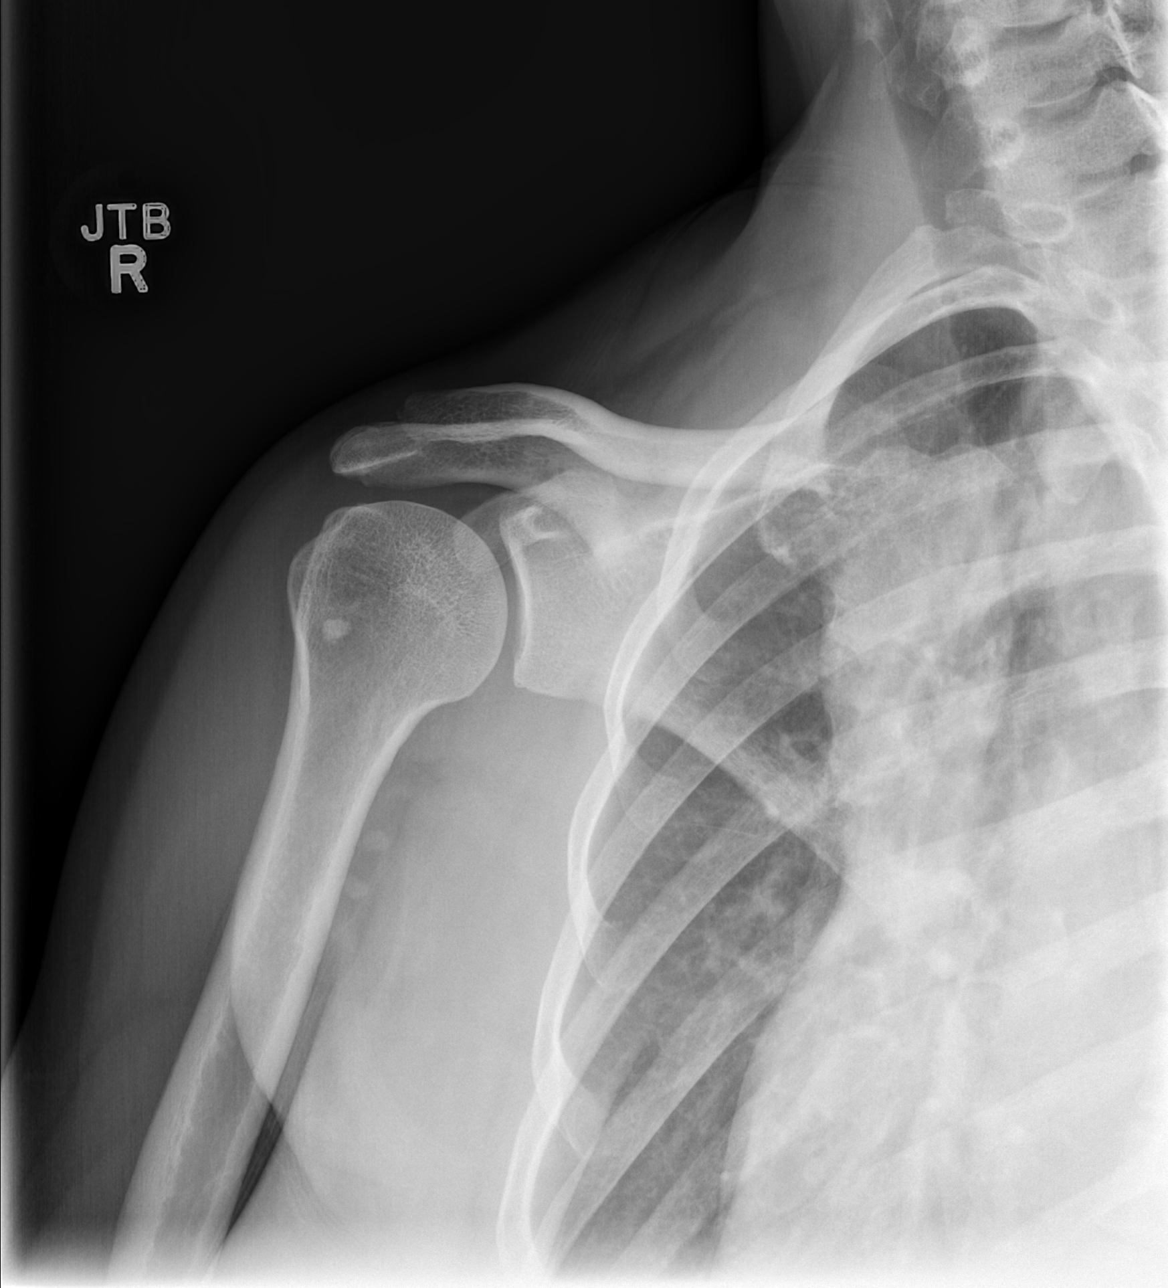

[w shoulder y view right]
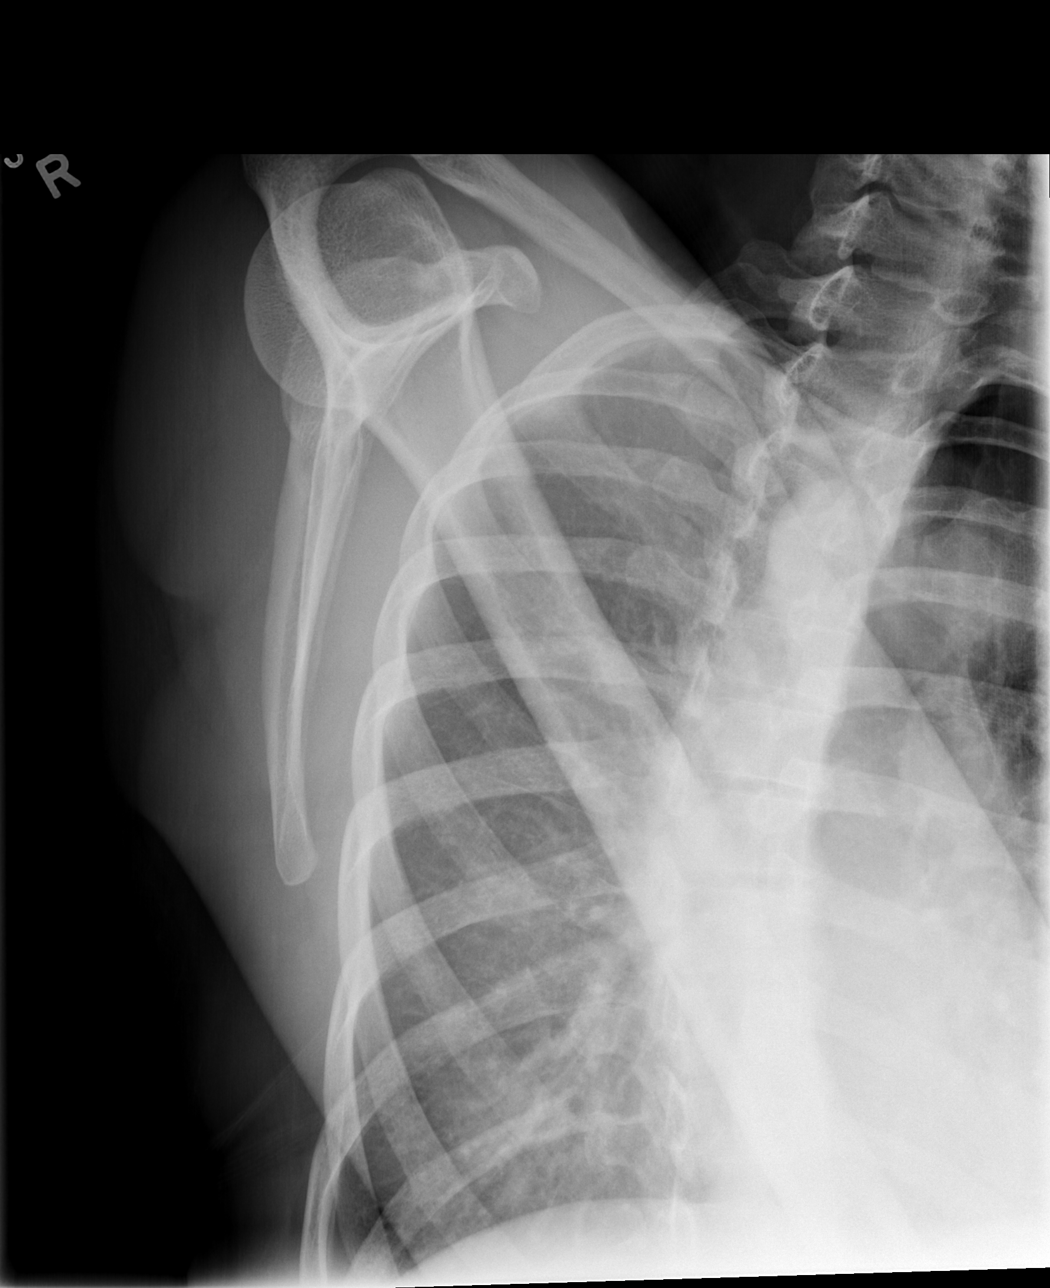

[x shoulder axillary right]
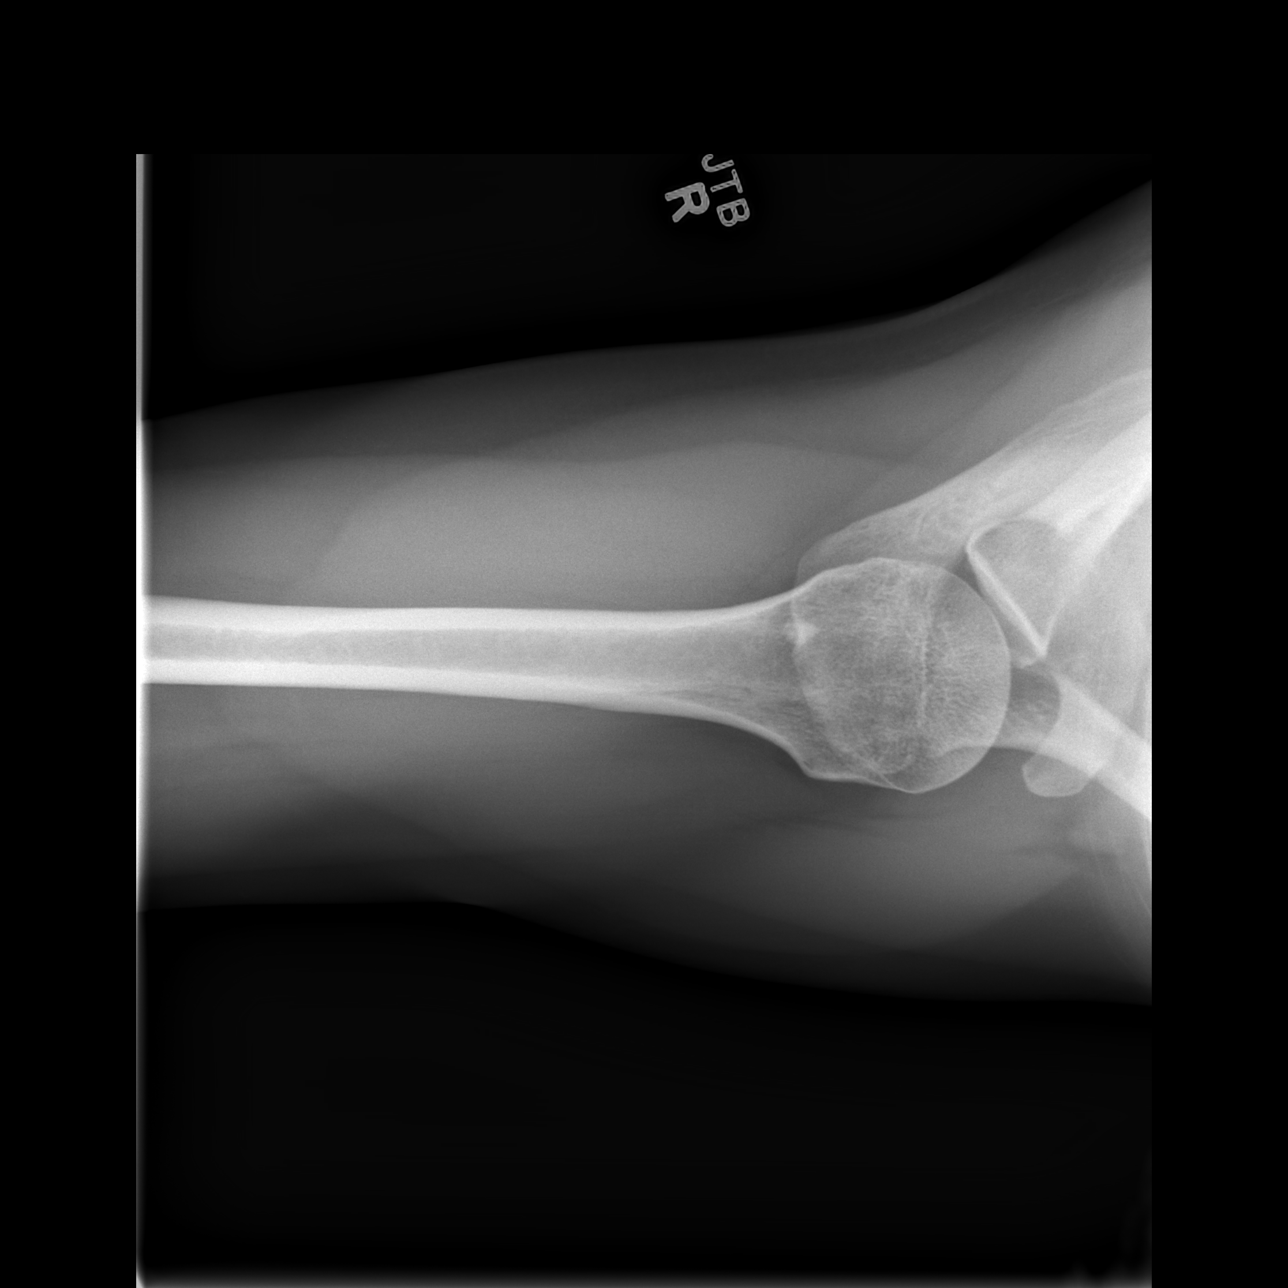

[3 of 3 positions shown; findings below may reference images not displayed]

FINDINGS: There is no evidence of fracture or dislocation. Visualized ribs
appear normal. There is no evidence of arthropathy or other focal
bone abnormality. Soft tissues are unremarkable.
IMPRESSION: Normal right shoulder.

## 2014-08-31 ENCOUNTER — Other Ambulatory Visit: Payer: Self-pay | Admitting: Obstetrics and Gynecology

## 2014-08-31 DIAGNOSIS — D259 Leiomyoma of uterus, unspecified: Secondary | ICD-10-CM

## 2014-09-04 ENCOUNTER — Telehealth: Payer: Self-pay | Admitting: Family Medicine

## 2014-09-04 NOTE — Telephone Encounter (Signed)
Ms. Leverett have been having swelling to feet and having a lot of pain. Need a referral to a podiatrist.  Also need to get a referral to a doctor for the hernia she have.  Have spoken with you regarding hernia.  Need to have that resolved asap as well as referral to the podiatrist.  Please call her to discuss.  Have nothing available until 8/31.   Cannot wait that long to have someone see her for her feet.

## 2014-09-08 NOTE — Telephone Encounter (Signed)
Dear Dema Severin Team I don't think we discussed a referral for a "hernia" or a referral to podiatry. She can come be seen by someone else sooner or you can put her on my [facility] clinic this Thursday as a late dbl book (11:30) THANKS! Dorcas Mcmurray

## 2014-09-09 ENCOUNTER — Encounter: Payer: Self-pay | Admitting: Family Medicine

## 2014-09-09 ENCOUNTER — Ambulatory Visit (INDEPENDENT_AMBULATORY_CARE_PROVIDER_SITE_OTHER): Payer: 59 | Admitting: Family Medicine

## 2014-09-09 VITALS — BP 129/70 | HR 56 | Temp 97.9°F | Ht 63.0 in | Wt 181.4 lb

## 2014-09-09 DIAGNOSIS — M25579 Pain in unspecified ankle and joints of unspecified foot: Secondary | ICD-10-CM | POA: Diagnosis not present

## 2014-09-09 DIAGNOSIS — R609 Edema, unspecified: Secondary | ICD-10-CM

## 2014-09-09 DIAGNOSIS — M958 Other specified acquired deformities of musculoskeletal system: Secondary | ICD-10-CM

## 2014-09-09 DIAGNOSIS — D259 Leiomyoma of uterus, unspecified: Secondary | ICD-10-CM

## 2014-09-09 DIAGNOSIS — IMO0002 Reserved for concepts with insufficient information to code with codable children: Secondary | ICD-10-CM

## 2014-09-09 LAB — COMPREHENSIVE METABOLIC PANEL
ALK PHOS: 44 U/L (ref 33–130)
ALT: 13 U/L (ref 6–29)
AST: 17 U/L (ref 10–35)
Albumin: 3.3 g/dL — ABNORMAL LOW (ref 3.6–5.1)
BILIRUBIN TOTAL: 0.3 mg/dL (ref 0.2–1.2)
BUN: 14 mg/dL (ref 7–25)
CO2: 26 mmol/L (ref 20–31)
CREATININE: 0.94 mg/dL (ref 0.50–1.05)
Calcium: 8.6 mg/dL (ref 8.6–10.4)
Chloride: 105 mmol/L (ref 98–110)
GLUCOSE: 63 mg/dL — AB (ref 65–99)
Potassium: 4.5 mmol/L (ref 3.5–5.3)
Sodium: 138 mmol/L (ref 135–146)
Total Protein: 5.9 g/dL — ABNORMAL LOW (ref 6.1–8.1)

## 2014-09-09 NOTE — Assessment & Plan Note (Signed)
I will check some lab work to reassure her. This seems like edema related to typical venous insufficiency related to age in activity. I gave her handout on some support hose and discussed how to use those.

## 2014-09-09 NOTE — Progress Notes (Signed)
   Subjective:    Patient ID: Carrie Roth, female    DOB: 1965-04-02, 49 y.o.   MRN: [id number]  HPI #1. Has had some lower extremity edema, particularly notable on days when she stands all day. She works for a [employer] The Day She Works She Is on Her Feet 8 or 10 or 12 Hours. Foot Swelling Goes down Somewhat Overnight. When Her Feet Swell She's Having Some Foot Tenderness and Pain. She's quite worried that she has some systemic issues such as liver problems kidney problems diabetes etc. this causing her lower extremity swelling. #2. She has had long history of plantar fasciitis and wants to see a podiatrist regarding that. #3. At last office visit we evaluated her for a "knot" on her stomach that she thought was a hernia. She wants to get this further evaluated. #4. She was seen by the gynecologist who has performed a repeat Pap smear and a pelvic ultrasound. She is scheduled for some other type of imaging procedure on the 16th of this month. She's not sure if that is a CT scan or not.   Review of Systems Fever, sweats, chills, unusual weight change. No calf pain. No numbness or tingling in her feet. No abdominal pain, no nausea.    Objective:   Physical Exam  Vital signs reviewed. GENERAL: Well-developed, well-nourished, no acute distress. CARDIOVASCULAR: Regular rate and rhythm no murmur gallop or rub LUNGS: Clear to auscultation bilaterally, no rales or wheeze. ABDOMEN: Soft positive bowel sounds. The area in the right mid quadrant that she points to as her concern for the "knot" is a mildly palpable nontender "mass". It's about 4-5 cm above the bellybutton and a little bit closer to the right midclavicular line. It is not firm. NEURO: No gross focal neurological deficits. Sensation to soft touch bilateral feet normal. MSK: Movement of extremity x 4. Trace nonpitting edema bilaterally in the feet.        Assessment & Plan:

## 2014-09-09 NOTE — Patient Instructions (Signed)
We are checking several labs to rule out problems with diabetes, liver or kidney issues. I suspect you foot and lower leg edema is rlalted to the venous insufficiency common as we get older.  Buy some over the counter supportive stockings, put them on [place] as they are not easy to get on when you are already swollen. I will send you a note about your blood work.  ELEVATE  YOUR FEET WHENEVER you have a chance

## 2014-09-09 NOTE — Assessment & Plan Note (Signed)
I urged her to complete her follow-up and workup with the OB/GYN is this is been ongoing problem for her for many years.

## 2014-09-09 NOTE — Assessment & Plan Note (Signed)
I discussed this with her again. I don't really think this is a hernia that don't really think it's an abdominal wall defect. I suspect it is related to her enlarged uterine fibroids. She has a CT scan of the abdomen and pelvis coming up, I think we'll just wait to further evaluate patent to we have the results of that imaging.

## 2014-09-10 ENCOUNTER — Encounter: Payer: Self-pay | Admitting: Family Medicine

## 2014-09-15 ENCOUNTER — Ambulatory Visit
Admission: RE | Admit: 2014-09-15 | Discharge: 2014-09-15 | Disposition: A | Discharge status: Home / Self Care | Payer: 59 | Source: Ambulatory Visit | Attending: Obstetrics and Gynecology | Admitting: Obstetrics and Gynecology

## 2014-09-15 ENCOUNTER — Other Ambulatory Visit: Payer: Self-pay | Admitting: Obstetrics and Gynecology

## 2014-09-15 DIAGNOSIS — D259 Leiomyoma of uterus, unspecified: Secondary | ICD-10-CM

## 2014-09-15 NOTE — Consult Note (Signed)
Chief Complaint: Patient was seen in consultation today for  Chief Complaint  Patient presents with  . Fibroids    Consult for [place]    . Advice Only   at the request of [place]  Referring Physician(s): [place]  History of Present Illness: Carrie Roth is a 49 y.o. G2 P98 A1 female who presents for evaluation of symptomatic uterine fibroids at the kind request of Dr. Raphael Gibney.    Carrie Roth who has carried a diagnosis of uterine fibroids for at least the past 9 years. Over the past year or so she has noticed a significant enlargement in the fibroids which has been confirmed by imaging (both CT and ultrasound).   Her cycles are regular occurring approximately every 28 days and last for 4 days. The first day is the heaviest but she only needs to change her pads every 4 hours. Overall, she feels her bleeding is in line with most women and denies menorrhagia. Her primary symptoms are dysmenorrhea and bulk related symptoms. She has significant cramping during her cycle and occasional random episodes of uterine cramping in between cycles. Additionally, she has pelvic heaviness, fullness and bloating. Occasionally she also gets rectal spasm.  She notes a small outpouching of her anterior abdominal wall just above the umbilicus. This seems to occur when she goes to the gym which she does 3 times per week.  Her sister also has uterine fibroids but suffers predominantly from menorrhagia.  She denies nausea, vomiting, fever or chills. She has no other systemic symptoms on the review of systems.  Past Medical History  Diagnosis Date  . Allergy   . Fibroids     Past Surgical History  Procedure Laterality Date  . Gynecologic cryosurgery      Allergies: Other  Medications: Prior to Admission medications   Medication Sig Start Date End Date Taking? Authorizing Provider  carboxymethylcellulose ([employer] LUBRICANT EYE DROPS) 0.5 % SOLN Apply to eyes as directed for relief of  itching    Yes Historical Provider, MD  EPINEPHrine (EPIPEN) 0.3 mg/0.3 mL DEVI Inject 0.3 mLs (0.3 mg total) into the muscle as directed. 04/12/10  Yes [name], MD  fexofenadine (ALLEGRA) 180 MG tablet Take 1 tablet (180 mg total) by mouth daily. Patient has failed trials of Claritin and Zyrtec. Pt requests BRAND only 04/12/10  Yes [name], MD  ibuprofen (IBU) 600 MG tablet Take 1 tablet (600 mg total) by mouth every 6 (six) hours as needed. For pain 04/12/10  Yes [name], MD  Multiple Vitamin (MULTIVITAMIN) capsule Take 1 capsule by mouth daily.   Yes Historical Provider, MD  fish oil-omega-3 fatty acids 1000 MG capsule Take 2 g by mouth daily.    Historical Provider, MD  fluconazole (DIFLUCAN) 150 MG tablet Take 1 tablet (150 mg total) by mouth once. Patient not taking: Reported on 09/15/2014 09/10/12   Gregor Hams, MD     Family History  Problem Relation Age of Onset  . Diabetes Mother   . Cancer Mother     breast    Social History   Social History  . Marital Status: Single    Spouse Name: N/A  . Number of Children: N/A  . Years of Education: N/A   Social History Main Topics  . Smoking status: Never Smoker   . Smokeless tobacco: Never Used  . Alcohol Use: No  . Drug Use: No  . Sexual Activity: Yes    Birth Control/ Protection: None  Other Topics Concern  . None   Social History Narrative     Review of Systems: A 12 point ROS discussed and pertinent positives are indicated in the HPI above.  All other systems are negative.  Review of Systems  Vital Signs: BP 151/77 mmHg  Pulse 56  Temp(Src) 98.3 F (36.8 C) (Oral)  Resp 14  Ht 5\' 3"  (1.6 m)  Wt 175 lb (79.379 kg)  BMI 31.01 kg/m2  SpO2 100%  LMP 08/14/2014 (Exact Date)  Physical Exam  Constitutional: She appears well-developed and well-nourished. No distress.  HENT:  Head: Normocephalic and atraumatic.  Eyes: No scleral icterus.  Cardiovascular: Normal rate and regular rhythm.     Pulmonary/Chest: Effort normal and breath sounds normal.  Abdominal: Soft. She exhibits mass. There is no tenderness. There is no guarding.  Firm lobular uterus palpated at the level of the umbilicus c/w [redacted] wk gestation.  Small supraumbilical hernia palpated  Nursing note and vitals reviewed.   Mallampati Score:  1  Imaging: No results found.  Labs:  CBC:  Recent Labs  07/01/14 1009  WBC 9.0  HGB 13.9  HCT 42.8  PLT 210    COAGS:  Recent Labs  07/01/14 1010  INR 0.99    BMP:  Recent Labs  07/01/14 1009 09/09/14 0935  NA 138 138  K 4.6 4.5  CL 105 105  CO2 28 26  GLUCOSE 77 63*  BUN 11 14  CALCIUM 7.9* 8.6  CREATININE 0.87 0.94    LIVER FUNCTION TESTS:  Recent Labs  07/01/14 1009 09/09/14 0935  BILITOT 0.4 0.3  AST 21 17  ALT 18 13  ALKPHOS 41 44  PROT 5.4* 5.9*  ALBUMIN 3.2* 3.3*    TUMOR MARKERS: No results for input(s): AFPTM, CEA, CA199, CHROMGRNA in the last 8760 hours.  Assessment and Plan:  Carrie Roth is a very pleasant 49 year old female with a long history of enlarging and progressively symptomatic uterine fibroids. Her primary symptoms are dysmenorrhea and bulk related. She has no significant menorrhagia.  We had a long discussion on the natural history of uterine fibroids and the average age of menopause in the [place] for 51 years old. We also discussed the differences between hysterectomy, myomectomy and uterine artery embolization.  At this time, Carrie Roth is not interested in hysterectomy. After reviewing her images, she is also intimidated by the thought of myomectomy given her very large uterus size and numerous large fibroids. She is very interested in a minimally invasive therapy and understands that on average uterine fibroids shrink by 50-60 percent following uterine artery embolization at 6 months postprocedure. Additionally, data shows an 80-85 percent clinical success rate for bulk related symptoms following  uterine artery embolization. She understands that [place] is not a perfect therapy in the setting of a very large, and bulky uterus but that it has a very high likelihood of giving her significant relief and bridging her to menopause.  1.) Schedule MRI of the pelvis with contrast for complete anatomic review prior to embolization  2.) Barring any unexpected abnormalities on the pelvic MRI, we will then schedule her for her uterine artery embolization. She would like to have the procedure performed in mid September in the weeks following her September cycle.  Thank you for this interesting consult.  I greatly enjoyed meeting Carrie Roth and look forward to participating in their care.  A copy of this report was sent to the requesting provider on this date.  Signed: Edwards,  HEATH 09/15/2014, 10:46 AM   I spent a total of  40 Minutes   in face to face in clinical consultation, greater than 50% of which was counseling/coordinating care for uterine fibroids.

## 2014-09-22 ENCOUNTER — Ambulatory Visit
Admission: RE | Admit: 2014-09-22 | Discharge: 2014-09-22 | Disposition: A | Discharge status: Home / Self Care | Payer: 59 | Source: Ambulatory Visit | Attending: Obstetrics and Gynecology | Admitting: Obstetrics and Gynecology

## 2014-09-22 DIAGNOSIS — D259 Leiomyoma of uterus, unspecified: Secondary | ICD-10-CM

## 2014-09-22 MED ORDER — GADOBENATE DIMEGLUMINE 529 MG/ML IV SOLN
15.0000 mL | Freq: Once | INTRAVENOUS | Status: AC | PRN
  Administered 2014-09-22: 15 mL via INTRAVENOUS

## 2014-09-23 ENCOUNTER — Ambulatory Visit (INDEPENDENT_AMBULATORY_CARE_PROVIDER_SITE_OTHER): Payer: 59 | Admitting: Family Medicine

## 2014-09-23 VITALS — BP 119/65 | HR 68 | Temp 98.7°F | Wt 180.2 lb

## 2014-09-23 DIAGNOSIS — R21 Rash and other nonspecific skin eruption: Secondary | ICD-10-CM | POA: Insufficient documentation

## 2014-09-23 MED ORDER — CETIRIZINE HCL 10 MG PO TABS: 10.0000 mg | ORAL_TABLET | Freq: Every day | ORAL | Status: DC

## 2014-09-23 MED ORDER — TRIAMCINOLONE ACETONIDE 0.1 % EX OINT: 1.0000 "application " | TOPICAL_OINTMENT | Freq: Two times a day (BID) | CUTANEOUS | Status: DC

## 2014-09-23 NOTE — Patient Instructions (Signed)
Thank you for coming in,   If there is no improvement in your symptoms in one week then call the clinic and we may need to change your treatment regimen.     Please feel free to call with any questions or concerns at any time, at [phone]. --Dr. Raeford Razor

## 2014-09-23 NOTE — Assessment & Plan Note (Signed)
Rash most consistent with allergic contact dermatitis. - Triamcinolone 0.1% ointment - Zyrtec 10 mg daily - If she doesn't have improvement in one week with this regimen and may need to consider some systemic steroid-induced.

## 2014-09-23 NOTE — Progress Notes (Signed)
   Subjective:    Patient ID: Carrie Roth, female    DOB: 06/10/1965, 49 y.o.   MRN: [id number]  Seen for Same day visit for   CC: rash   Had rash for 6 days. Location: shoulder, chest, legs  Medications tried: none  Similar rash in past: no New medications or antibiotics: no Tick, Insect or new pet exposure: no Recent travel: no New detergent or soap: no Immunocompromised: no  Symptoms Itching: yes Pain over rash: no Feeling ill all over: no Fever: no Mouth sores: no Face or tongue swelling: no Trouble breathing: no Joint swelling or pain: no  PMH - Smoking status noted.     Review of Systems   See HPI for ROS. Objective:  BP 119/65 mmHg  Pulse 68  Temp(Src) 98.7 F (37.1 C) (Oral)  Wt 180 lb 3.2 oz (81.738 kg)  LMP 09/09/2014  General: NAD Skin: vesicles occuring on anterior right tibia with streak like configuration, chest and left shoulder      Assessment & Plan:  See Problem List Documentation

## 2014-10-01 ENCOUNTER — Other Ambulatory Visit: Payer: Self-pay | Admitting: Interventional Radiology

## 2014-10-01 DIAGNOSIS — D252 Subserosal leiomyoma of uterus: Secondary | ICD-10-CM

## 2014-10-29 ENCOUNTER — Inpatient Hospital Stay (HOSPITAL_COMMUNITY): Admission: RE | Admit: 2014-10-29 | Payer: 59 | Source: Ambulatory Visit

## 2014-10-29 ENCOUNTER — Ambulatory Visit (HOSPITAL_COMMUNITY): Admission: RE | Admit: 2014-10-29 | Payer: 59 | Source: Ambulatory Visit

## 2014-12-25 ENCOUNTER — Other Ambulatory Visit: Payer: Self-pay | Admitting: Radiology

## 2014-12-28 ENCOUNTER — Other Ambulatory Visit: Payer: Self-pay | Admitting: Radiology

## 2014-12-29 ENCOUNTER — Encounter (HOSPITAL_COMMUNITY): Payer: Self-pay

## 2014-12-29 ENCOUNTER — Observation Stay (HOSPITAL_COMMUNITY)
Admission: RE | Admit: 2014-12-29 | Discharge: 2014-12-30 | Disposition: A | Discharge status: Home / Self Care | Payer: 59 | Source: Ambulatory Visit | Attending: Interventional Radiology | Admitting: Interventional Radiology

## 2014-12-29 ENCOUNTER — Ambulatory Visit (HOSPITAL_COMMUNITY)
Admission: RE | Admit: 2014-12-29 | Discharge: 2014-12-29 | Disposition: A | Discharge status: Home / Self Care | Payer: 59 | Source: Ambulatory Visit | Attending: Interventional Radiology | Admitting: Interventional Radiology

## 2014-12-29 DIAGNOSIS — D259 Leiomyoma of uterus, unspecified: Principal | ICD-10-CM | POA: Insufficient documentation

## 2014-12-29 DIAGNOSIS — D252 Subserosal leiomyoma of uterus: Secondary | ICD-10-CM

## 2014-12-29 DIAGNOSIS — D219 Benign neoplasm of connective and other soft tissue, unspecified: Secondary | ICD-10-CM | POA: Diagnosis present

## 2014-12-29 HISTORY — DX: Pelvic and perineal pain unspecified side: R10.20

## 2014-12-29 HISTORY — DX: Pelvic and perineal pain: R10.2

## 2014-12-29 LAB — CBC WITH DIFFERENTIAL/PLATELET
BASOS ABS: 0 10*3/uL (ref 0.0–0.1)
BASOS PCT: 0 %
EOS PCT: 2 %
Eosinophils Absolute: 0.1 10*3/uL (ref 0.0–0.7)
HEMATOCRIT: 42 % (ref 36.0–46.0)
Hemoglobin: 13.7 g/dL (ref 12.0–15.0)
LYMPHS PCT: 37 %
Lymphs Abs: 1.5 10*3/uL (ref 0.7–4.0)
MCH: 29.2 pg (ref 26.0–34.0)
MCHC: 32.6 g/dL (ref 30.0–36.0)
MCV: 89.6 fL (ref 78.0–100.0)
MONO ABS: 0.3 10*3/uL (ref 0.1–1.0)
MONOS PCT: 8 %
Neutro Abs: 2.2 10*3/uL (ref 1.7–7.7)
Neutrophils Relative %: 53 %
PLATELETS: 209 10*3/uL (ref 150–400)
RBC: 4.69 MIL/uL (ref 3.87–5.11)
RDW: 13.1 % (ref 11.5–15.5)
WBC: 4.2 10*3/uL (ref 4.0–10.5)

## 2014-12-29 LAB — COMPREHENSIVE METABOLIC PANEL
ALT: 25 U/L (ref 14–54)
ANION GAP: 6 (ref 5–15)
AST: 40 U/L (ref 15–41)
Albumin: 3.4 g/dL — ABNORMAL LOW (ref 3.5–5.0)
Alkaline Phosphatase: 38 U/L (ref 38–126)
BILIRUBIN TOTAL: 0.5 mg/dL (ref 0.3–1.2)
BUN: 17 mg/dL (ref 6–20)
CHLORIDE: 107 mmol/L (ref 101–111)
CO2: 25 mmol/L (ref 22–32)
Calcium: 8.6 mg/dL — ABNORMAL LOW (ref 8.9–10.3)
Creatinine, Ser: 1.08 mg/dL — ABNORMAL HIGH (ref 0.44–1.00)
GFR calc Af Amer: >60 mL/min (ref >60)
GFR, EST NON AFRICAN AMERICAN: 58 mL/min — AB (ref >60)
Glucose, Bld: 96 mg/dL (ref 65–99)
POTASSIUM: 4.4 mmol/L (ref 3.5–5.1)
Sodium: 138 mmol/L (ref 135–145)
TOTAL PROTEIN: 6.4 g/dL — AB (ref 6.5–8.1)

## 2014-12-29 LAB — PROTIME-INR
INR: 1 (ref 0.00–1.49)
PROTHROMBIN TIME: 13.4 s (ref 11.6–15.2)

## 2014-12-29 LAB — HCG, SERUM, QUALITATIVE: PREG SERUM: NEGATIVE

## 2014-12-29 LAB — APTT: APTT: 29 s (ref 24–37)

## 2014-12-29 MED ORDER — DOCUSATE SODIUM 100 MG PO CAPS
100.0000 mg | ORAL_CAPSULE | Freq: Two times a day (BID) | ORAL | Status: DC
  Administered 2014-12-29 – 2014-12-30 (×2): 100 mg via ORAL
  Filled 2014-12-29 (×3): qty 1

## 2014-12-29 MED ORDER — ENOXAPARIN SODIUM 40 MG/0.4ML ~~LOC~~ SOLN
40.0000 mg | SUBCUTANEOUS | Status: AC
  Administered 2014-12-29: 40 mg via SUBCUTANEOUS
  Filled 2014-12-29: qty 0.4

## 2014-12-29 MED ORDER — IOHEXOL 300 MG/ML  SOLN
100.0000 mL | Freq: Once | INTRAMUSCULAR | Status: AC | PRN
  Administered 2014-12-29: 90 mL via INTRAVENOUS

## 2014-12-29 MED ORDER — HYDROMORPHONE 1 MG/ML IV SOLN
INTRAVENOUS | Status: AC
  Filled 2014-12-29: qty 25

## 2014-12-29 MED ORDER — HYDROMORPHONE 1 MG/ML IV SOLN
INTRAVENOUS | Status: DC
  Administered 2014-12-29: 12:00:00 via INTRAVENOUS
  Administered 2014-12-29: 3 mg via INTRAVENOUS

## 2014-12-29 MED ORDER — PREDNISONE 20 MG PO TABS
20.0000 mg | ORAL_TABLET | Freq: Every day | ORAL | Status: DC
  Filled 2014-12-29 (×2): qty 1

## 2014-12-29 MED ORDER — LIDOCAINE HCL 1 % IJ SOLN
INTRAMUSCULAR | Status: AC
  Filled 2014-12-29: qty 20

## 2014-12-29 MED ORDER — DIPHENHYDRAMINE HCL 50 MG/ML IJ SOLN
12.5000 mg | Freq: Four times a day (QID) | INTRAMUSCULAR | Status: DC | PRN
Start: 2014-12-29 — End: 2014-12-29
  Administered 2014-12-29: 12.5 mg via INTRAVENOUS
  Filled 2014-12-29: qty 1

## 2014-12-29 MED ORDER — FENTANYL CITRATE (PF) 100 MCG/2ML IJ SOLN: INTRAMUSCULAR | Status: AC | PRN

## 2014-12-29 MED ORDER — KETOROLAC TROMETHAMINE 30 MG/ML IJ SOLN
INTRAMUSCULAR | Status: AC | PRN
  Administered 2014-12-29: 30 mg via INTRAVENOUS

## 2014-12-29 MED ORDER — HYDROMORPHONE HCL 2 MG/ML IJ SOLN
INTRAMUSCULAR | Status: AC
  Administered 2014-12-29: 0.5 mg
  Filled 2014-12-29: qty 1

## 2014-12-29 MED ORDER — SODIUM CHLORIDE 0.9 % IJ SOLN: 3.0000 mL | INTRAMUSCULAR | Status: DC | PRN

## 2014-12-29 MED ORDER — DEXAMETHASONE 4 MG PO TABS
8.0000 mg | ORAL_TABLET | Freq: Once | ORAL | Status: AC
  Administered 2014-12-29: 8 mg via ORAL
  Filled 2014-12-29: qty 2

## 2014-12-29 MED ORDER — MIDAZOLAM HCL 2 MG/2ML IJ SOLN
INTRAMUSCULAR | Status: AC | PRN
  Administered 2014-12-29: 1 mg via INTRAVENOUS

## 2014-12-29 MED ORDER — MAGNESIUM HYDROXIDE 400 MG/5ML PO SUSP
30.0000 mL | Freq: Every day | ORAL | Status: DC
  Administered 2014-12-29: 30 mL via ORAL
  Filled 2014-12-29 (×3): qty 30

## 2014-12-29 MED ORDER — SODIUM CHLORIDE 0.9 % IV SOLN: 250.0000 mL | INTRAVENOUS | Status: DC | PRN

## 2014-12-29 MED ORDER — SODIUM CHLORIDE 0.9 % IV SOLN
8.0000 mg | Freq: Once | INTRAVENOUS | Status: AC
  Administered 2014-12-29: 8 mg via INTRAVENOUS
  Filled 2014-12-29: qty 4

## 2014-12-29 MED ORDER — KETOROLAC TROMETHAMINE 30 MG/ML IJ SOLN
30.0000 mg | Freq: Four times a day (QID) | INTRAMUSCULAR | Status: DC
  Administered 2014-12-29 – 2014-12-30 (×3): 30 mg via INTRAVENOUS
  Filled 2014-12-29 (×7): qty 1

## 2014-12-29 MED ORDER — ONDANSETRON HCL 4 MG/2ML IJ SOLN: 4.0000 mg | Freq: Four times a day (QID) | INTRAMUSCULAR | Status: DC | PRN

## 2014-12-29 MED ORDER — DIPHENHYDRAMINE HCL 50 MG/ML IJ SOLN: 12.5000 mg | Freq: Four times a day (QID) | INTRAMUSCULAR | Status: DC | PRN

## 2014-12-29 MED ORDER — FENTANYL CITRATE (PF) 100 MCG/2ML IJ SOLN
INTRAMUSCULAR | Status: AC
  Filled 2014-12-29: qty 4

## 2014-12-29 MED ORDER — ATROPINE SULFATE 0.1 MG/ML IJ SOLN
INTRAMUSCULAR | Status: AC
  Filled 2014-12-29: qty 10

## 2014-12-29 MED ORDER — KETOROLAC TROMETHAMINE 30 MG/ML IJ SOLN: 30.0000 mg | Freq: Once | INTRAMUSCULAR | Status: AC

## 2014-12-29 MED ORDER — SODIUM CHLORIDE 0.9 % IJ SOLN
3.0000 mL | Freq: Two times a day (BID) | INTRAMUSCULAR | Status: DC
  Administered 2014-12-29: 3 mL via INTRAVENOUS

## 2014-12-29 MED ORDER — KETOROLAC TROMETHAMINE 30 MG/ML IJ SOLN
INTRAMUSCULAR | Status: AC
  Filled 2014-12-29: qty 1

## 2014-12-29 MED ORDER — PROMETHAZINE HCL 25 MG PO TABS: 25.0000 mg | ORAL_TABLET | Freq: Three times a day (TID) | ORAL | Status: DC | PRN

## 2014-12-29 MED ORDER — DIPHENHYDRAMINE HCL 12.5 MG/5ML PO ELIX
12.5000 mg | ORAL_SOLUTION | Freq: Four times a day (QID) | ORAL | Status: DC | PRN
  Filled 2014-12-29: qty 5

## 2014-12-29 MED ORDER — SODIUM CHLORIDE 0.9 % IJ SOLN: 9.0000 mL | INTRAMUSCULAR | Status: DC | PRN

## 2014-12-29 MED ORDER — DEXAMETHASONE SODIUM PHOSPHATE 10 MG/ML IJ SOLN
4.0000 mg | Freq: Once | INTRAMUSCULAR | Status: AC
  Administered 2014-12-29: 4 mg via INTRAVENOUS
  Filled 2014-12-29: qty 1

## 2014-12-29 MED ORDER — IOHEXOL 300 MG/ML  SOLN
100.0000 mL | Freq: Once | INTRAMUSCULAR | Status: AC | PRN
  Administered 2014-12-29: 30 mL via INTRAVENOUS

## 2014-12-29 MED ORDER — DEXTROSE 5 % IV SOLN
1.0000 g | Freq: Once | INTRAVENOUS | Status: AC
  Administered 2014-12-29: 1 g via INTRAVENOUS
  Filled 2014-12-29: qty 10

## 2014-12-29 MED ORDER — HYDROMORPHONE HCL 1 MG/ML IJ SOLN: 1.0000 mg | INTRAMUSCULAR | Status: DC | PRN

## 2014-12-29 MED ORDER — HYDROCODONE-ACETAMINOPHEN 5-325 MG PO TABS: 1.0000 | ORAL_TABLET | ORAL | Status: DC | PRN

## 2014-12-29 MED ORDER — NALOXONE HCL 0.4 MG/ML IJ SOLN: 0.4000 mg | INTRAMUSCULAR | Status: DC | PRN

## 2014-12-29 MED ORDER — PROMETHAZINE HCL 25 MG RE SUPP: 25.0000 mg | Freq: Three times a day (TID) | RECTAL | Status: DC | PRN

## 2014-12-29 MED ORDER — SODIUM CHLORIDE 0.9 % IV SOLN
INTRAVENOUS | Status: DC
  Administered 2014-12-29: 08:00:00 via INTRAVENOUS

## 2014-12-29 MED ORDER — MIDAZOLAM HCL 2 MG/2ML IJ SOLN
INTRAMUSCULAR | Status: AC
  Filled 2014-12-29: qty 6

## 2014-12-29 MED ORDER — SODIUM CHLORIDE 0.9 % IV SOLN: INTRAVENOUS | Status: AC

## 2014-12-29 NOTE — Progress Notes (Signed)
Agree with previous RN's assessment. 

## 2014-12-29 NOTE — Progress Notes (Signed)
Becca in IR notified pre ops meds given.

## 2014-12-29 NOTE — Discharge Instructions (Signed)
Uterine Artery Embolization for Fibroids, Care After °Refer to this sheet in the next few weeks. These instructions provide you with information on caring for yourself after your procedure. Your health care provider may also give you more specific instructions. Your treatment has been planned according to current medical practices, but problems sometimes occur. Call your health care provider if you have any problems or questions after your procedure. °WHAT TO EXPECT AFTER THE PROCEDURE °After your procedure, it is typical to have cramping in the pelvis. You will be given pain medicine to control it. °HOME CARE INSTRUCTIONS °· Only take over-the-counter or prescription medicines for pain, discomfort, or fever as directed by your health care provider. °· Do not take aspirin. It can cause bleeding. °· Follow your health care provider's advice regarding medicines given to you, diet, activity, and when to begin sexual activity. °· See your health care provider for follow-up care as directed. °SEEK MEDICAL CARE IF: °· You have a fever. °· You have redness, swelling, and pain around your incision site. °· You have pus draining from your incision. °· You have a rash. °SEEK IMMEDIATE MEDICAL CARE IF: °· You have bleeding from your incision site. °· You have difficulty breathing. °· You have chest pain. °· You have severe abdominal pain. °· You have leg pain. °· You become dizzy and faint. °  °This information is not intended to replace advice given to you by your health care provider. Make sure you discuss any questions you have with your health care provider. °  °Document Released: 11/06/2012 Document Reviewed: 11/06/2012 °Elsevier Interactive Patient Education ©2016 [employer] °Uterine Artery Embolization for Fibroids °Uterine artery embolization is a nonsurgical treatment to shrink fibroids. A thin plastic tube (catheter) is used to inject material that blocks off the blood supply to the fibroid, which causes the  fibroid to shrink. °LET YOUR HEALTH CARE PROVIDER KNOW ABOUT: °· Any allergies you have. °· All medicines you are taking, including vitamins, herbs, eye drops, creams, and over-the-counter medicines. °· Previous problems you or members of your family have had with the use of anesthetics. °· Any blood disorders you have. °· Previous surgeries you have had. °· Medical conditions you have. °RISKS AND COMPLICATIONS °· Injury to the uterus from decreased blood supply °· Infection. °· Blood infection (septicemia). °· Lack of menstrual periods (amenorrhea). °· Death of tissue cells (necrosis) around your bladder or vulva. °· Development of a hole between organs or from an organ to the surface of your skin (fistula). °· Blood clot in the legs (deep vein thrombosis) or lung (pulmonary embolus). °BEFORE THE PROCEDURE °· Ask your health care provider about changing or stopping your regular medicines.   °· Do not take aspirin or blood thinners (anticoagulants) for 1 week before the surgery or as directed by your health care provider. °· Do not eat or drink anything for 8 hours before the surgery or as directed by your health care provider.   °· Empty your bladder before the procedure begins. °PROCEDURE  °· An IV tube will be placed into one of your veins. This will be used to give you a sedative and pain medication (conscious sedation). °· You will be given a medicine that numbs the area (local anesthetic). °· A small cut will be made in your groin. A catheter is then inserted into the main artery of your leg. °· The catheter will be guided through the artery to your uterus. A series of images will be taken while dye is injected   through the catheter in your groin. X-rays are taken at the same time. This is done to provide a road map of the blood supply to your uterus and fibroids. °· Tiny plastic spheres, about the size of sand grains, will be injected through the catheter. Metal coils may be used to help block the artery. The  particles will lodge in tiny branches of the uterine artery that supplies blood to the fibroids. °· The procedure is repeated on the artery that supplies the other side of the uterus. °· The catheter is then removed and pressure is held to stop any bleeding. No stitches are needed. °· A dressing is then placed over the cut (incision). °AFTER THE PROCEDURE °· You will be taken to a recovery area where your progress will be monitored until you are awake, stable, and taking fluids well. If there are no other problems, you will then be moved to a regular hospital room. °· You will be observed overnight in the hospital. °· You will have cramping that should be controlled with pain medication. °  °This information is not intended to replace advice given to you by your health care provider. Make sure you discuss any questions you have with your health care provider. °  °Document Released: 04/03/2005 Document Revised: 11/06/2012 Document Reviewed: 08/01/2012 °Elsevier Interactive Patient Education ©2016 Else[employer] Inc. ° °

## 2014-12-29 NOTE — Progress Notes (Signed)
Spoke with radiologist who stated patient can get meds in IR or just prior to going down to IR.

## 2014-12-29 NOTE — H&P (Signed)
     Referring Physician(s): Stringer,A  Chief Complaint:  Symptomatic uterine fibroids  Subjective:  Pt recently seen by Dr. Laurence Ferrari on 09/15/14 for treatment options for symptomatic uterine fibroids. She was deemed an appropriate candidate for bilateral uterine artery embolization. She presents today for the above procedure. She currently denies fever, HA, CP, dyspnea, cough, N/V, back pain. She does have menorrhagia with associated pelvic cramping, bloating, occ rectal spasms and pelvic fullness.  Past Medical History  Diagnosis Date  . Allergy   . Fibroids   . Pelvic pain in female    Past Surgical History  Procedure Laterality Date  . Gynecologic cryosurgery       Allergies: Other  Medications: Prior to Admission medications   Medication Sig Start Date End Date Taking? Authorizing Provider  EPINEPHrine (EPIPEN) 0.3 mg/0.3 mL DEVI Inject 0.3 mLs (0.3 mg total) into the muscle as directed. 04/12/10  Yes [name], MD     Vital Signs: BP 147/87 mmHg  Pulse 59  Temp(Src) 98.3 F (36.8 C) (Oral)  Resp 16  Ht 5\' 3"  (1.6 m)  Wt 187 lb (84.823 kg)  BMI 33.13 kg/m2  SpO2 100%  LMP 12/23/2014  Physical Exam  Constitutional: She is oriented to person, place, and time. She appears well-developed and well-nourished.  Cardiovascular: Normal rate and regular rhythm.   Pulmonary/Chest: Effort normal and breath sounds normal.  Abdominal: Soft. Bowel sounds are normal. There is no tenderness.  obese  Musculoskeletal: Normal range of motion. She exhibits no edema.  Neurological: She is alert and oriented to person, place, and time.    Imaging: No results found.  Labs:  CBC:  Recent Labs  07/01/14 1009 12/29/14 0820  WBC 9.0 4.2  HGB 13.9 13.7  HCT 42.8 42.0  PLT 210 209    COAGS:  Recent Labs  07/01/14 1010 12/29/14 0820  INR 0.99 1.00  APTT  --  29    BMP:  Recent Labs  07/01/14 1009 09/09/14 0935 12/29/14 0820  NA 138 138 138  K 4.6  4.5 4.4  CL 105 105 107  CO2 28 26 25   GLUCOSE 77 63* 96  BUN 11 14 17   CALCIUM 7.9* 8.6 8.6*  CREATININE 0.87 0.94 1.08*  GFRNONAA  --   --  58*  GFRAA  --   --  >60    LIVER FUNCTION TESTS:  Recent Labs  07/01/14 1009 09/09/14 0935 12/29/14 0820  BILITOT 0.4 0.3 0.5  AST 21 17 40  ALT 18 13 25   ALKPHOS 41 44 38  PROT 5.4* 5.9* 6.4*  ALBUMIN 3.2* 3.3* 3.4*    Assessment and Plan: Pt with hx symptomatic uterine fibroids. Plan is for bilateral uterine artery embolization today. Details/risks of procedure, including but not limited to, internal bleeding, infection, nontarget embolization, contrast nephropathy d/w pt with her understanding and consent. Postprocedure she will be admitted for overnight observation.    Signed: D. Rowe Robert 12/29/2014, 9:20 AM   I spent a total of 30 minutes at the the patient's bedside AND on the patient's hospital floor or unit, greater than 50% of which was counseling/coordinating care for bilateral uterine artery embolization

## 2014-12-29 NOTE — Progress Notes (Signed)
Patient ID: Carrie Roth, female   DOB: 03-22-1965, 49 y.o.   MRN: [id number]          Subjective: Pt with expected intermittent pelvic cramping, occ nausea; denies vomiting   Allergies: Other  Medications: Prior to Admission medications   Medication Sig Start Date End Date Taking? Authorizing Provider  EPINEPHrine (EPIPEN) 0.3 mg/0.3 mL DEVI Inject 0.3 mLs (0.3 mg total) into the muscle as directed. 04/12/10  Yes [name], MD     Vital Signs: BP 140/74 mmHg  Pulse 56  Temp(Src) 97.8 F (36.6 C) (Oral)  Resp 20  Ht 5\' 3"  (1.6 m)  Wt 187 lb (84.823 kg)  BMI 33.13 kg/m2  SpO2 100%  LMP 12/23/2014  Physical Exam awake/alert; abd obese, soft, mildly tender mid pelvic region to palpation; puncture site rt CFA soft, NT, no hematoma; intact distal pulses  Imaging: Ir Angiogram Pelvis Selective Or Supraselective  12/29/2014  CLINICAL DATA:  49 year old female with symptomatic uterine fibroids. She presents for bilateral uterine artery embolization. EXAM: IR EMBO TUMOR ORGAN ISCHEMIA INFARCT INC GUIDE ROADMAPPING; PELVIC SELECTIVE ARTERIOGRAPHY; IR ULTRASOUND GUIDANCE VASC ACCESS RIGHT; ADDITIONAL ARTERIOGRAPHY Date: 12/29/2014 PROCEDURE: 1. Ultrasound-guided puncture right common femoral artery 2. Catheterization of the left internal iliac artery with arteriogram 3. Catheterization of the inferior rectal artery with arteriogram 4. Catheterization of the uterine artery with arteriogram 5. Particle embolization of the left uterine artery to near stasis 6. Catheterization of the right internal iliac artery with arteriogram 7. Catheterization of the right uterine artery with arteriogram 8. Particle embolization of the right uterine artery to near stasis 9. Limited right common femoral arteriogram 10. Application of Cordis Exoseal device Interventional Radiologist:  Criselda Peaches, MD ANESTHESIA/SEDATION: Moderate (conscious) sedation was used. 1 mg Versed, 0.5 mg Dilaudid were  administered intravenously. The patient's vital signs were monitored continuously by radiology nursing throughout the procedure. Sedation Time: 57 minutes MEDICATIONS: 8 mg Zofran, 4 mg Decadron, 1 g Rocephin and 30 mg Toradol administered intravenously FLUOROSCOPY TIME:  19 minutes for a total of 1,376 mGy CONTRAST:  71mL OMNIPAQUE IOHEXOL 300 MG/ML  SOLN TECHNIQUE: Informed consent was obtained from the patient following explanation of the procedure, risks, benefits and alternatives. The patient understands, agrees and consents for the procedure. All questions were addressed. A time out was performed. Maximal barrier sterile technique utilized including caps, mask, sterile gowns, sterile gloves, large sterile drape, hand hygiene, and Betadine skin prep. The right groin was interrogated with ultrasound. The right common femoral artery was found to be widely patent. An image was obtained and stored for the medical record. Local anesthesia was attained by infiltration with 1% lidocaine. A small dermatotomy was made. Under real-time sonographic guidance, the vessel was punctured with a 21 gauge micropuncture needle. An image was obtained and stored for the medical record. Using standard technique, the initial micro wire was exchanged for a 0.035 inch wire using a 5 [place] transitional micro sheath. The micro sheath was then exchanged for a working 5 [place] vascular sheath. A C2 Cobra catheter was advanced up and over the aortic bifurcation in used to select the left internal iliac artery. An internal iliac arteriogram was performed. The uterine artery is hyper trophic. Multiple round tumor blushes project over the uterus consistent with known multiple uterine fibroids. A high-flow Renegade micro catheter was next advanced over a fat and 16 wire. The micro catheter was initially advanced into an arterial branch. Contrast injection demonstrated that the micro catheter was actually  an inferior rectal artery. The micro  catheter was brought back into the anterior division of the internal iliac artery and then readvanced this time successfully selecting the origin of the uterine artery. This was confirmed by a hand injection of contrast material. The micro catheter was next advanced into the horizontal segments of the uterine artery. Angiography demonstrates no visible vesicovaginal branches. Particle embolization was then performed using 1 vial of 500 - 700 micron embospheres followed by 2.75 vials of 700 - 900 micron embospheres. Embolization was taken to near stasis. A post embolization arteriogram confirms significant pruning of the distal vessels and reflux. The micro catheter was removed. The C2 cobra catheter was next formed into a Waltman's loop and used to select the right internal iliac artery. Angiography was performed demonstrating the origin of the hypertrophic right uterine artery. Similar to the contralateral side, there is tumor blush consistent with multiple right-sided fibroids. There is a large dominant fibroid in the right lateral aspect of the lower uterus. A Renegade high flow micro catheter was next advanced into the horizontal segment of the right uterine artery. Catheter location was confirmed by angiography. No significant vesicovaginal branches distal to the catheter tip. Particle embolization was then performed to near stasis using 1 vial of 500 - 700 micron embospheres followed by 2.25 vials of 700 - 900 micron embospheres. Post embolization arteriography confirms adequate pruning of the vessels and significant reflux. The micro catheter was removed. The Waltman's loop was un formed in the abdominal aorta and an the catheter removed. A limited right common femoral arteriogram was performed confirming common femoral arterial access. Hemostasis was attained with the assistance of a Cordis Exoseal extra arterial collagen plug. COMPLICATIONS: None Estimated blood loss:  0 mL IMPRESSION: Technically  successful bilateral uterine artery embolization. Signed, Criselda Peaches, MD [facility] Radiology Electronically Signed   By: Jacqulynn Cadet M.D.   On: 12/29/2014 13:18   Ir Angiogram Pelvis Selective Or Supraselective  12/29/2014  CLINICAL DATA:  49 year old female with symptomatic uterine fibroids. She presents for bilateral uterine artery embolization. EXAM: IR EMBO TUMOR ORGAN ISCHEMIA INFARCT INC GUIDE ROADMAPPING; PELVIC SELECTIVE ARTERIOGRAPHY; IR ULTRASOUND GUIDANCE VASC ACCESS RIGHT; ADDITIONAL ARTERIOGRAPHY Date: 12/29/2014 PROCEDURE: 1. Ultrasound-guided puncture right common femoral artery 2. Catheterization of the left internal iliac artery with arteriogram 3. Catheterization of the inferior rectal artery with arteriogram 4. Catheterization of the uterine artery with arteriogram 5. Particle embolization of the left uterine artery to near stasis 6. Catheterization of the right internal iliac artery with arteriogram 7. Catheterization of the right uterine artery with arteriogram 8. Particle embolization of the right uterine artery to near stasis 9. Limited right common femoral arteriogram 10. Application of Cordis Exoseal device Interventional Radiologist:  Criselda Peaches, MD ANESTHESIA/SEDATION: Moderate (conscious) sedation was used. 1 mg Versed, 0.5 mg Dilaudid were administered intravenously. The patient's vital signs were monitored continuously by radiology nursing throughout the procedure. Sedation Time: 57 minutes MEDICATIONS: 8 mg Zofran, 4 mg Decadron, 1 g Rocephin and 30 mg Toradol administered intravenously FLUOROSCOPY TIME:  19 minutes for a total of 1,376 mGy CONTRAST:  25mL OMNIPAQUE IOHEXOL 300 MG/ML  SOLN TECHNIQUE: Informed consent was obtained from the patient following explanation of the procedure, risks, benefits and alternatives. The patient understands, agrees and consents for the procedure. All questions were  addressed. A time out was performed. Maximal barrier sterile technique utilized including caps, mask, sterile gowns, sterile gloves, large sterile drape, hand hygiene, and  Betadine skin prep. The right groin was interrogated with ultrasound. The right common femoral artery was found to be widely patent. An image was obtained and stored for the medical record. Local anesthesia was attained by infiltration with 1% lidocaine. A small dermatotomy was made. Under real-time sonographic guidance, the vessel was punctured with a 21 gauge micropuncture needle. An image was obtained and stored for the medical record. Using standard technique, the initial micro wire was exchanged for a 0.035 inch wire using a 5 [place] transitional micro sheath. The micro sheath was then exchanged for a working 5 [place] vascular sheath. A C2 Cobra catheter was advanced up and over the aortic bifurcation in used to select the left internal iliac artery. An internal iliac arteriogram was performed. The uterine artery is hyper trophic. Multiple round tumor blushes project over the uterus consistent with known multiple uterine fibroids. A high-flow Renegade micro catheter was next advanced over a fat and 16 wire. The micro catheter was initially advanced into an arterial branch. Contrast injection demonstrated that the micro catheter was actually an inferior rectal artery. The micro catheter was brought back into the anterior division of the internal iliac artery and then readvanced this time successfully selecting the origin of the uterine artery. This was confirmed by a hand injection of contrast material. The micro catheter was next advanced into the horizontal segments of the uterine artery. Angiography demonstrates no visible vesicovaginal branches. Particle embolization was then performed using 1 vial of 500 - 700 micron embospheres followed by 2.75 vials of 700 - 900 micron embospheres. Embolization was taken to near stasis. A post  embolization arteriogram confirms significant pruning of the distal vessels and reflux. The micro catheter was removed. The C2 cobra catheter was next formed into a Waltman's loop and used to select the right internal iliac artery. Angiography was performed demonstrating the origin of the hypertrophic right uterine artery. Similar to the contralateral side, there is tumor blush consistent with multiple right-sided fibroids. There is a large dominant fibroid in the right lateral aspect of the lower uterus. A Renegade high flow micro catheter was next advanced into the horizontal segment of the right uterine artery. Catheter location was confirmed by angiography. No significant vesicovaginal branches distal to the catheter tip. Particle embolization was then performed to near stasis using 1 vial of 500 - 700 micron embospheres followed by 2.25 vials of 700 - 900 micron embospheres. Post embolization arteriography confirms adequate pruning of the vessels and significant reflux. The micro catheter was removed. The Waltman's loop was un formed in the abdominal aorta and an the catheter removed. A limited right common femoral arteriogram was performed confirming common femoral arterial access. Hemostasis was attained with the assistance of a Cordis Exoseal extra arterial collagen plug. COMPLICATIONS: None Estimated blood loss:  0 mL IMPRESSION: Technically successful bilateral uterine artery embolization. Signed, Criselda Peaches, MD [facility] Radiology Electronically Signed   By: Jacqulynn Cadet M.D.   On: 12/29/2014 13:18   Ir Angiogram Selective Each Additional Vessel  12/29/2014  CLINICAL DATA:  49 year old female with symptomatic uterine fibroids. She presents for bilateral uterine artery embolization. EXAM: IR EMBO TUMOR ORGAN ISCHEMIA INFARCT INC GUIDE ROADMAPPING; PELVIC SELECTIVE ARTERIOGRAPHY; IR ULTRASOUND GUIDANCE VASC ACCESS RIGHT; ADDITIONAL  ARTERIOGRAPHY Date: 12/29/2014 PROCEDURE: 1. Ultrasound-guided puncture right common femoral artery 2. Catheterization of the left internal iliac artery with arteriogram 3. Catheterization of the inferior rectal artery with arteriogram 4. Catheterization of the uterine artery with  arteriogram 5. Particle embolization of the left uterine artery to near stasis 6. Catheterization of the right internal iliac artery with arteriogram 7. Catheterization of the right uterine artery with arteriogram 8. Particle embolization of the right uterine artery to near stasis 9. Limited right common femoral arteriogram 10. Application of Cordis Exoseal device Interventional Radiologist:  Criselda Peaches, MD ANESTHESIA/SEDATION: Moderate (conscious) sedation was used. 1 mg Versed, 0.5 mg Dilaudid were administered intravenously. The patient's vital signs were monitored continuously by radiology nursing throughout the procedure. Sedation Time: 57 minutes MEDICATIONS: 8 mg Zofran, 4 mg Decadron, 1 g Rocephin and 30 mg Toradol administered intravenously FLUOROSCOPY TIME:  19 minutes for a total of 1,376 mGy CONTRAST:  58mL OMNIPAQUE IOHEXOL 300 MG/ML  SOLN TECHNIQUE: Informed consent was obtained from the patient following explanation of the procedure, risks, benefits and alternatives. The patient understands, agrees and consents for the procedure. All questions were addressed. A time out was performed. Maximal barrier sterile technique utilized including caps, mask, sterile gowns, sterile gloves, large sterile drape, hand hygiene, and Betadine skin prep. The right groin was interrogated with ultrasound. The right common femoral artery was found to be widely patent. An image was obtained and stored for the medical record. Local anesthesia was attained by infiltration with 1% lidocaine. A small dermatotomy was made. Under real-time sonographic guidance, the vessel was punctured with a 21 gauge micropuncture needle. An image was  obtained and stored for the medical record. Using standard technique, the initial micro wire was exchanged for a 0.035 inch wire using a 5 [place] transitional micro sheath. The micro sheath was then exchanged for a working 5 [place] vascular sheath. A C2 Cobra catheter was advanced up and over the aortic bifurcation in used to select the left internal iliac artery. An internal iliac arteriogram was performed. The uterine artery is hyper trophic. Multiple round tumor blushes project over the uterus consistent with known multiple uterine fibroids. A high-flow Renegade micro catheter was next advanced over a fat and 16 wire. The micro catheter was initially advanced into an arterial branch. Contrast injection demonstrated that the micro catheter was actually an inferior rectal artery. The micro catheter was brought back into the anterior division of the internal iliac artery and then readvanced this time successfully selecting the origin of the uterine artery. This was confirmed by a hand injection of contrast material. The micro catheter was next advanced into the horizontal segments of the uterine artery. Angiography demonstrates no visible vesicovaginal branches. Particle embolization was then performed using 1 vial of 500 - 700 micron embospheres followed by 2.75 vials of 700 - 900 micron embospheres. Embolization was taken to near stasis. A post embolization arteriogram confirms significant pruning of the distal vessels and reflux. The micro catheter was removed. The C2 cobra catheter was next formed into a Waltman's loop and used to select the right internal iliac artery. Angiography was performed demonstrating the origin of the hypertrophic right uterine artery. Similar to the contralateral side, there is tumor blush consistent with multiple right-sided fibroids. There is a large dominant fibroid in the right lateral aspect of the lower uterus. A Renegade high flow micro catheter was next advanced into the  horizontal segment of the right uterine artery. Catheter location was confirmed by angiography. No significant vesicovaginal branches distal to the catheter tip. Particle embolization was then performed to near stasis using 1 vial of 500 - 700 micron embospheres followed by 2.25 vials of 700 - 900 micron embospheres. Post embolization  arteriography confirms adequate pruning of the vessels and significant reflux. The micro catheter was removed. The Waltman's loop was un formed in the abdominal aorta and an the catheter removed. A limited right common femoral arteriogram was performed confirming common femoral arterial access. Hemostasis was attained with the assistance of a Cordis Exoseal extra arterial collagen plug. COMPLICATIONS: None Estimated blood loss:  0 mL IMPRESSION: Technically successful bilateral uterine artery embolization. Signed, Criselda Peaches, MD [facility] Radiology Electronically Signed   By: Jacqulynn Cadet M.D.   On: 12/29/2014 13:18   Ir Angiogram Selective Each Additional Vessel  12/29/2014  CLINICAL DATA:  49 year old female with symptomatic uterine fibroids. She presents for bilateral uterine artery embolization. EXAM: IR EMBO TUMOR ORGAN ISCHEMIA INFARCT INC GUIDE ROADMAPPING; PELVIC SELECTIVE ARTERIOGRAPHY; IR ULTRASOUND GUIDANCE VASC ACCESS RIGHT; ADDITIONAL ARTERIOGRAPHY Date: 12/29/2014 PROCEDURE: 1. Ultrasound-guided puncture right common femoral artery 2. Catheterization of the left internal iliac artery with arteriogram 3. Catheterization of the inferior rectal artery with arteriogram 4. Catheterization of the uterine artery with arteriogram 5. Particle embolization of the left uterine artery to near stasis 6. Catheterization of the right internal iliac artery with arteriogram 7. Catheterization of the right uterine artery with arteriogram 8. Particle embolization of the right uterine artery to near stasis 9. Limited right  common femoral arteriogram 10. Application of Cordis Exoseal device Interventional Radiologist:  Criselda Peaches, MD ANESTHESIA/SEDATION: Moderate (conscious) sedation was used. 1 mg Versed, 0.5 mg Dilaudid were administered intravenously. The patient's vital signs were monitored continuously by radiology nursing throughout the procedure. Sedation Time: 57 minutes MEDICATIONS: 8 mg Zofran, 4 mg Decadron, 1 g Rocephin and 30 mg Toradol administered intravenously FLUOROSCOPY TIME:  19 minutes for a total of 1,376 mGy CONTRAST:  73mL OMNIPAQUE IOHEXOL 300 MG/ML  SOLN TECHNIQUE: Informed consent was obtained from the patient following explanation of the procedure, risks, benefits and alternatives. The patient understands, agrees and consents for the procedure. All questions were addressed. A time out was performed. Maximal barrier sterile technique utilized including caps, mask, sterile gowns, sterile gloves, large sterile drape, hand hygiene, and Betadine skin prep. The right groin was interrogated with ultrasound. The right common femoral artery was found to be widely patent. An image was obtained and stored for the medical record. Local anesthesia was attained by infiltration with 1% lidocaine. A small dermatotomy was made. Under real-time sonographic guidance, the vessel was punctured with a 21 gauge micropuncture needle. An image was obtained and stored for the medical record. Using standard technique, the initial micro wire was exchanged for a 0.035 inch wire using a 5 [place] transitional micro sheath. The micro sheath was then exchanged for a working 5 [place] vascular sheath. A C2 Cobra catheter was advanced up and over the aortic bifurcation in used to select the left internal iliac artery. An internal iliac arteriogram was performed. The uterine artery is hyper trophic. Multiple round tumor blushes project over the uterus consistent with known multiple uterine fibroids. A high-flow Renegade micro catheter  was next advanced over a fat and 16 wire. The micro catheter was initially advanced into an arterial branch. Contrast injection demonstrated that the micro catheter was actually an inferior rectal artery. The micro catheter was brought back into the anterior division of the internal iliac artery and then readvanced this time successfully selecting the origin of the uterine artery. This was confirmed by a hand injection of contrast material. The micro catheter was next advanced into the horizontal  segments of the uterine artery. Angiography demonstrates no visible vesicovaginal branches. Particle embolization was then performed using 1 vial of 500 - 700 micron embospheres followed by 2.75 vials of 700 - 900 micron embospheres. Embolization was taken to near stasis. A post embolization arteriogram confirms significant pruning of the distal vessels and reflux. The micro catheter was removed. The C2 cobra catheter was next formed into a Waltman's loop and used to select the right internal iliac artery. Angiography was performed demonstrating the origin of the hypertrophic right uterine artery. Similar to the contralateral side, there is tumor blush consistent with multiple right-sided fibroids. There is a large dominant fibroid in the right lateral aspect of the lower uterus. A Renegade high flow micro catheter was next advanced into the horizontal segment of the right uterine artery. Catheter location was confirmed by angiography. No significant vesicovaginal branches distal to the catheter tip. Particle embolization was then performed to near stasis using 1 vial of 500 - 700 micron embospheres followed by 2.25 vials of 700 - 900 micron embospheres. Post embolization arteriography confirms adequate pruning of the vessels and significant reflux. The micro catheter was removed. The Waltman's loop was un formed in the abdominal aorta and an the catheter removed. A limited right common femoral arteriogram was performed  confirming common femoral arterial access. Hemostasis was attained with the assistance of a Cordis Exoseal extra arterial collagen plug. COMPLICATIONS: None Estimated blood loss:  0 mL IMPRESSION: Technically successful bilateral uterine artery embolization. Signed, Criselda Peaches, MD [facility] Radiology Electronically Signed   By: Jacqulynn Cadet M.D.   On: 12/29/2014 13:18   Ir US Guide Vasc Access Right  12/29/2014  CLINICAL DATA:  49 year old female with symptomatic uterine fibroids. She presents for bilateral uterine artery embolization. EXAM: IR EMBO TUMOR ORGAN ISCHEMIA INFARCT INC GUIDE ROADMAPPING; PELVIC SELECTIVE ARTERIOGRAPHY; IR ULTRASOUND GUIDANCE VASC ACCESS RIGHT; ADDITIONAL ARTERIOGRAPHY Date: 12/29/2014 PROCEDURE: 1. Ultrasound-guided puncture right common femoral artery 2. Catheterization of the left internal iliac artery with arteriogram 3. Catheterization of the inferior rectal artery with arteriogram 4. Catheterization of the uterine artery with arteriogram 5. Particle embolization of the left uterine artery to near stasis 6. Catheterization of the right internal iliac artery with arteriogram 7. Catheterization of the right uterine artery with arteriogram 8. Particle embolization of the right uterine artery to near stasis 9. Limited right common femoral arteriogram 10. Application of Cordis Exoseal device Interventional Radiologist:  Criselda Peaches, MD ANESTHESIA/SEDATION: Moderate (conscious) sedation was used. 1 mg Versed, 0.5 mg Dilaudid were administered intravenously. The patient's vital signs were monitored continuously by radiology nursing throughout the procedure. Sedation Time: 57 minutes MEDICATIONS: 8 mg Zofran, 4 mg Decadron, 1 g Rocephin and 30 mg Toradol administered intravenously FLUOROSCOPY TIME:  19 minutes for a total of 1,376 mGy CONTRAST:  38mL OMNIPAQUE IOHEXOL 300 MG/ML  SOLN TECHNIQUE: Informed consent was  obtained from the patient following explanation of the procedure, risks, benefits and alternatives. The patient understands, agrees and consents for the procedure. All questions were addressed. A time out was performed. Maximal barrier sterile technique utilized including caps, mask, sterile gowns, sterile gloves, large sterile drape, hand hygiene, and Betadine skin prep. The right groin was interrogated with ultrasound. The right common femoral artery was found to be widely patent. An image was obtained and stored for the medical record. Local anesthesia was attained by infiltration with 1% lidocaine. A small dermatotomy was made. Under real-time sonographic guidance, the vessel was  punctured with a 21 gauge micropuncture needle. An image was obtained and stored for the medical record. Using standard technique, the initial micro wire was exchanged for a 0.035 inch wire using a 5 [place] transitional micro sheath. The micro sheath was then exchanged for a working 5 [place] vascular sheath. A C2 Cobra catheter was advanced up and over the aortic bifurcation in used to select the left internal iliac artery. An internal iliac arteriogram was performed. The uterine artery is hyper trophic. Multiple round tumor blushes project over the uterus consistent with known multiple uterine fibroids. A high-flow Renegade micro catheter was next advanced over a fat and 16 wire. The micro catheter was initially advanced into an arterial branch. Contrast injection demonstrated that the micro catheter was actually an inferior rectal artery. The micro catheter was brought back into the anterior division of the internal iliac artery and then readvanced this time successfully selecting the origin of the uterine artery. This was confirmed by a hand injection of contrast material. The micro catheter was next advanced into the horizontal segments of the uterine artery. Angiography demonstrates no visible vesicovaginal branches. Particle  embolization was then performed using 1 vial of 500 - 700 micron embospheres followed by 2.75 vials of 700 - 900 micron embospheres. Embolization was taken to near stasis. A post embolization arteriogram confirms significant pruning of the distal vessels and reflux. The micro catheter was removed. The C2 cobra catheter was next formed into a Waltman's loop and used to select the right internal iliac artery. Angiography was performed demonstrating the origin of the hypertrophic right uterine artery. Similar to the contralateral side, there is tumor blush consistent with multiple right-sided fibroids. There is a large dominant fibroid in the right lateral aspect of the lower uterus. A Renegade high flow micro catheter was next advanced into the horizontal segment of the right uterine artery. Catheter location was confirmed by angiography. No significant vesicovaginal branches distal to the catheter tip. Particle embolization was then performed to near stasis using 1 vial of 500 - 700 micron embospheres followed by 2.25 vials of 700 - 900 micron embospheres. Post embolization arteriography confirms adequate pruning of the vessels and significant reflux. The micro catheter was removed. The Waltman's loop was un formed in the abdominal aorta and an the catheter removed. A limited right common femoral arteriogram was performed confirming common femoral arterial access. Hemostasis was attained with the assistance of a Cordis Exoseal extra arterial collagen plug. COMPLICATIONS: None Estimated blood loss:  0 mL IMPRESSION: Technically successful bilateral uterine artery embolization. Signed, Criselda Peaches, MD [facility] Radiology Electronically Signed   By: Jacqulynn Cadet M.D.   On: 12/29/2014 13:18   Ir Embo Tumor Organ Ischemia Infarct Inc Guide Roadmapping  12/29/2014  CLINICAL DATA:  49 year old female with symptomatic uterine fibroids. She presents for  bilateral uterine artery embolization. EXAM: IR EMBO TUMOR ORGAN ISCHEMIA INFARCT INC GUIDE ROADMAPPING; PELVIC SELECTIVE ARTERIOGRAPHY; IR ULTRASOUND GUIDANCE VASC ACCESS RIGHT; ADDITIONAL ARTERIOGRAPHY Date: 12/29/2014 PROCEDURE: 1. Ultrasound-guided puncture right common femoral artery 2. Catheterization of the left internal iliac artery with arteriogram 3. Catheterization of the inferior rectal artery with arteriogram 4. Catheterization of the uterine artery with arteriogram 5. Particle embolization of the left uterine artery to near stasis 6. Catheterization of the right internal iliac artery with arteriogram 7. Catheterization of the right uterine artery with arteriogram 8. Particle embolization of the right uterine artery to near stasis 9. Limited right common femoral arteriogram 10.  Application of Cordis Exoseal device Interventional Radiologist:  Criselda Peaches, MD ANESTHESIA/SEDATION: Moderate (conscious) sedation was used. 1 mg Versed, 0.5 mg Dilaudid were administered intravenously. The patient's vital signs were monitored continuously by radiology nursing throughout the procedure. Sedation Time: 57 minutes MEDICATIONS: 8 mg Zofran, 4 mg Decadron, 1 g Rocephin and 30 mg Toradol administered intravenously FLUOROSCOPY TIME:  19 minutes for a total of 1,376 mGy CONTRAST:  41mL OMNIPAQUE IOHEXOL 300 MG/ML  SOLN TECHNIQUE: Informed consent was obtained from the patient following explanation of the procedure, risks, benefits and alternatives. The patient understands, agrees and consents for the procedure. All questions were addressed. A time out was performed. Maximal barrier sterile technique utilized including caps, mask, sterile gowns, sterile gloves, large sterile drape, hand hygiene, and Betadine skin prep. The right groin was interrogated with ultrasound. The right common femoral artery was found to be widely patent. An image was obtained and stored for the medical record. Local anesthesia was  attained by infiltration with 1% lidocaine. A small dermatotomy was made. Under real-time sonographic guidance, the vessel was punctured with a 21 gauge micropuncture needle. An image was obtained and stored for the medical record. Using standard technique, the initial micro wire was exchanged for a 0.035 inch wire using a 5 [place] transitional micro sheath. The micro sheath was then exchanged for a working 5 [place] vascular sheath. A C2 Cobra catheter was advanced up and over the aortic bifurcation in used to select the left internal iliac artery. An internal iliac arteriogram was performed. The uterine artery is hyper trophic. Multiple round tumor blushes project over the uterus consistent with known multiple uterine fibroids. A high-flow Renegade micro catheter was next advanced over a fat and 16 wire. The micro catheter was initially advanced into an arterial branch. Contrast injection demonstrated that the micro catheter was actually an inferior rectal artery. The micro catheter was brought back into the anterior division of the internal iliac artery and then readvanced this time successfully selecting the origin of the uterine artery. This was confirmed by a hand injection of contrast material. The micro catheter was next advanced into the horizontal segments of the uterine artery. Angiography demonstrates no visible vesicovaginal branches. Particle embolization was then performed using 1 vial of 500 - 700 micron embospheres followed by 2.75 vials of 700 - 900 micron embospheres. Embolization was taken to near stasis. A post embolization arteriogram confirms significant pruning of the distal vessels and reflux. The micro catheter was removed. The C2 cobra catheter was next formed into a Waltman's loop and used to select the right internal iliac artery. Angiography was performed demonstrating the origin of the hypertrophic right uterine artery. Similar to the contralateral side, there is tumor blush consistent  with multiple right-sided fibroids. There is a large dominant fibroid in the right lateral aspect of the lower uterus. A Renegade high flow micro catheter was next advanced into the horizontal segment of the right uterine artery. Catheter location was confirmed by angiography. No significant vesicovaginal branches distal to the catheter tip. Particle embolization was then performed to near stasis using 1 vial of 500 - 700 micron embospheres followed by 2.25 vials of 700 - 900 micron embospheres. Post embolization arteriography confirms adequate pruning of the vessels and significant reflux. The micro catheter was removed. The Waltman's loop was un formed in the abdominal aorta and an the catheter removed. A limited right common femoral arteriogram was performed confirming common femoral arterial access. Hemostasis was attained with the assistance  of a Cordis Exoseal extra arterial collagen plug. COMPLICATIONS: None Estimated blood loss:  0 mL IMPRESSION: Technically successful bilateral uterine artery embolization. Signed, Criselda Peaches, MD [facility] Radiology Electronically Signed   By: Jacqulynn Cadet M.D.   On: 12/29/2014 13:18    Labs:  CBC:  Recent Labs  07/01/14 1009 12/29/14 0820  WBC 9.0 4.2  HGB 13.9 13.7  HCT 42.8 42.0  PLT 210 209    COAGS:  Recent Labs  07/01/14 1010 12/29/14 0820  INR 0.99 1.00  APTT  --  29    BMP:  Recent Labs  07/01/14 1009 09/09/14 0935 12/29/14 0820  NA 138 138 138  K 4.6 4.5 4.4  CL 105 105 107  CO2 28 26 25   GLUCOSE 77 63* 96  BUN 11 14 17   CALCIUM 7.9* 8.6 8.6*  CREATININE 0.87 0.94 1.08*  GFRNONAA  --   --  58*  GFRAA  --   --  >60    LIVER FUNCTION TESTS:  Recent Labs  07/01/14 1009 09/09/14 0935 12/29/14 0820  BILITOT 0.4 0.3 0.5  AST 21 17 40  ALT 18 13 25   ALKPHOS 41 44 38  PROT 5.4* 5.9* 6.4*  ALBUMIN 3.2* 3.3* 3.4*    Assessment and Plan: Pt with hx  symptomatic uterine fibroids, s/p bilat uterine artery embolization 11/29; for overnight obs for pain control; hydrate(creat 1.08); on PCA dilaudid, antiemetics as needed; for f/u with Dr. Laurence Ferrari in [place] clinic in 2-4 weeks; d/c  foley at 1900   Signed: D. Rowe Robert 12/29/2014, 4:04 PM   I spent a total of 15 minutes at the the patient's bedside AND on the patient's hospital floor or unit, greater than 50% of which was counseling/coordinating care for uterine fibroid embolization

## 2014-12-29 NOTE — Procedures (Signed)
Interventional Radiology Procedure Note  Procedure: Uterine artery embolization  Vascular Access: Right CFA, 12F.  + ExoSeal  Complications: None  Estimated Blood Loss:  0 mL  Recommendations:  - Bedrest x 4 hrs - ADAT - Hydrate - Pain control  Signed,  Criselda Peaches, MD

## 2014-12-29 NOTE — Sedation Documentation (Signed)
44fr sheath removed from right fem artery by Dr. Laurence Ferrari. Hemostasis achieved using exoseal closure device and manual pressure held for 5 minutes by Lambert Mody, RTR. Groin level 0, RDP +2, RPT +1/doplar.

## 2014-12-30 ENCOUNTER — Other Ambulatory Visit: Payer: Self-pay | Admitting: Radiology

## 2014-12-30 DIAGNOSIS — D259 Leiomyoma of uterus, unspecified: Secondary | ICD-10-CM

## 2014-12-30 MED ORDER — HYDROCODONE-ACETAMINOPHEN 5-325 MG PO TABS: 1.0000 | ORAL_TABLET | ORAL | Status: DC | PRN

## 2014-12-30 MED ORDER — ONDANSETRON HCL 4 MG PO TABS: 4.0000 mg | ORAL_TABLET | Freq: Three times a day (TID) | ORAL | Status: DC | PRN

## 2014-12-30 MED ORDER — DOCUSATE SODIUM 100 MG PO CAPS: 100.0000 mg | ORAL_CAPSULE | Freq: Two times a day (BID) | ORAL | Status: DC

## 2014-12-30 MED ORDER — IBUPROFEN 600 MG PO TABS: 600.0000 mg | ORAL_TABLET | Freq: Four times a day (QID) | ORAL | Status: DC | PRN

## 2014-12-30 MED ORDER — PREDNISONE 10 MG PO TABS: 10.0000 mg | ORAL_TABLET | Freq: Every day | ORAL | Status: AC

## 2014-12-30 NOTE — Discharge Summary (Signed)
Patient ID: Carrie Roth MRN: [id number] DOB/AGE: 1965/09/27 49 y.o.  Admit date: 12/29/2014 Discharge date: 12/30/2014  Admission Diagnoses: Symptomatic uterine fibroids  Discharge Diagnoses:  Active Problems:   Fibroids S/p successful uterine artery embolization 12/29/2014  Discharged Condition: Good, stable.   Hospital Course: Carrie Roth is a 49 y.o. G2 P80 A1 female with the diagnosis of uterine fibroids for at least the past 9 years. Over the past year or so she has noticed a significant enlargement in the fibroids which has been confirmed by imaging (both CT and ultrasound).Her cycles are regular occurring approximately every 28 days and last for 4 days. The first day is the heaviest but she only needs to change her pads every 4 hours. Overall, she feels her bleeding is in line with most women and denies menorrhagia. Her primary symptoms are dysmenorrhea and bulk related symptoms. She has significant cramping during her cycle and occasional random episodes of uterine cramping in between cycles. Additionally, she has pelvic heaviness, fullness and bloating. Occasionally she also gets rectal spasm. She was seen in full IR consult on 09/15/14 and deemed a candidate for uterine fibroid embolization.  She presented as an outpatient and underwent elective successful uterine artery embolization on 12/29/2014 by Dr. Laurence Ferrari and was admitted overnight for observation and pain control. She states overnight her pelvic cramping was approximately 9/10 and today improved at 5/10. She has ate breakfast without nausea or vomiting. She denies any fever or chills. She denies any right groin access site pain, bleeding or swelling. She denies any chest pain or shortness of breath. She has urinated on her own after foley removal without difficulty. She has ambulated without lightheadedness or dizziness. She states she is ready for discharge home. She was given the below instructions and medications and  verbalized understanding. Her vitals are stable and she has been seen and deemed medically stable for discharge home.   Treatments: S/p successful uterine artery embolization 12/29/2014  Discharge Exam: Blood pressure 110/93, pulse 58, temperature 98.5 F (36.9 C), temperature source Oral, resp. rate 20, height 5\' 3"  (1.6 m), weight 187 lb (84.823 kg), last menstrual period 12/23/2014, SpO2 100 %.  Physical Exam:  General: A&Ox3, NAD, sitting up in bed eating Heart: RRR Lungs: CTA b/l Abd: Soft, NT, ND, (+) BS, minimal pelvic TTP Ext: RCFA access site dressing C/D/I, soft, NT, no signs of bleeding/hematoma/redness, DP 1+ b/l  Disposition: 01-Home or Self Care  Discharge Instructions    Call MD for:  difficulty breathing, headache or visual disturbances    Complete by:  As directed      Call MD for:  extreme fatigue    Complete by:  As directed      Call MD for:  hives    Complete by:  As directed      Call MD for:  persistant dizziness or light-headedness    Complete by:  As directed      Call MD for:  persistant nausea and vomiting    Complete by:  As directed      Call MD for:  redness, tenderness, or signs of infection (pain, swelling, redness, odor or green/yellow discharge around incision site)    Complete by:  As directed      Call MD for:  severe uncontrolled pain    Complete by:  As directed      Call MD for:  temperature >100.4    Complete by:  As directed      Diet -  low sodium heart healthy    Complete by:  As directed      Driving Restrictions    Complete by:  As directed   No driving x 3 days     Increase activity slowly    Complete by:  As directed      Lifting restrictions    Complete by:  As directed   No lifting over 15 lbs x 3 days     Remove dressing in 24 hours    Complete by:  As directed             Medication List    TAKE these medications        docusate sodium 100 MG capsule  Commonly known as:  COLACE  Take 1 capsule (100 mg total) by  mouth 2 (two) times daily.     EPINEPHrine 0.3 mg/0.3 mL Devi  Commonly known as:  EPIPEN  Inject 0.3 mLs (0.3 mg total) into the muscle as directed.     HYDROcodone-acetaminophen 5-325 MG tablet  Commonly known as:  NORCO/VICODIN  Take 1-2 tablets by mouth every 4 (four) hours as needed for moderate pain.     ibuprofen 600 MG tablet  Commonly known as:  ADVIL,MOTRIN  Take 1 tablet (600 mg total) by mouth every 6 (six) hours as needed for moderate pain or cramping.     ondansetron 4 MG tablet  Commonly known as:  ZOFRAN  Take 1 tablet (4 mg total) by mouth every 8 (eight) hours as needed for nausea or vomiting.     predniSONE 10 MG tablet  Commonly known as:  DELTASONE  Take 1 tablet (10 mg total) by mouth daily with breakfast. Take (5) tablets on day 1, take (4) tablets on day 2, take (3) tablets on day 3, take (2) tablets on day 4, take (1) tablet on day 5  Start taking on:  12/31/2014           Follow-up Information    Follow up with Jacqulynn Cadet, MD On 01/12/2015.   Specialties:  Interventional Radiology, Radiology   Why:  Please follow up with Dr. Laurence Ferrari on Tuesday, December 13th at 3:30pm. Please arrive at 3:15pm for this appointment.    Contact information:   [place] [id number] [phone]        Signed: Hedy Jacob 12/30/2014, 1:42 PM   I have spent Less Than 30 Minutes discharging Carrie Roth.

## 2014-12-30 NOTE — Progress Notes (Signed)
Patient left with family.

## 2014-12-30 NOTE — Progress Notes (Signed)
Patient discharged.  Educated on discharge instructions, medications, and follow-up appointment.  Prescriptions given to patient.  Patient collected belongings.  IV removed- clean/dry/intact.  No questions or concerns at this time. AVS signed.  Patient awaiting ride

## 2015-01-12 ENCOUNTER — Ambulatory Visit
Admit: 2015-01-12 | Discharge: 2015-01-12 | Disposition: A | Discharge status: Home / Self Care | Payer: 59 | Attending: Interventional Radiology | Admitting: Interventional Radiology

## 2015-01-12 DIAGNOSIS — D259 Leiomyoma of uterus, unspecified: Secondary | ICD-10-CM

## 2015-01-12 NOTE — Progress Notes (Signed)
Chief Complaint: Patient was seen in consultation today for  Chief Complaint  Patient presents with  . Follow-up    2 wk follow up [place]     at the request of Ena Dawley  Referring Physician(s): Ena Dawley  History of Present Illness: Carrie Roth is a 49 y.o. female with symptomatic uterine fibroids who is now 2 weeks status post uterine artery embolization performed on 12/29/2014.  She is recovering fairly well. She continues to have intermittent lower abdominal pain including an episode which woke her from sleep last night. She feels that the pain is just above her bladder. She has been taking her 600 mg ibuprofen capsules with good relief. She no longer requires use of her narcotic or antinausea medications.    She suffered with some constipation for the first for 5 days following her procedure. This is subsequent been resolved with the use of stool softeners) juice but now she has mild diarrhea. She feels that her bowels have not yet reset properly. She has mild spotting and discharge which is not unexpected. No foul odor or purulent discharge. Denies shortness of breath, chest pain, fever or chills.  Past Medical History  Diagnosis Date  . Allergy   . Fibroids   . Pelvic pain in female     Past Surgical History  Procedure Laterality Date  . Gynecologic cryosurgery      Allergies: Other  Medications: Prior to Admission medications   Medication Sig Start Date End Date Taking? Authorizing Provider  EPINEPHrine (EPIPEN) 0.3 mg/0.3 mL DEVI Inject 0.3 mLs (0.3 mg total) into the muscle as directed. 04/12/10  Yes [name], MD  HYDROcodone-acetaminophen (NORCO/VICODIN) 5-325 MG tablet Take 1-2 tablets by mouth every 4 (four) hours as needed for moderate pain. 12/30/14  Yes Hedy Jacob, PA-C  docusate sodium (COLACE) 100 MG capsule Take 1 capsule (100 mg total) by mouth 2 (two) times daily. Patient not taking: Reported on 01/12/2015 12/30/14   Hedy Jacob, PA-C  ibuprofen (ADVIL,MOTRIN) 600 MG tablet Take 1 tablet (600 mg total) by mouth every 6 (six) hours as needed for moderate pain or cramping. Patient not taking: Reported on 01/12/2015 12/30/14   Hedy Jacob, PA-C  ondansetron (ZOFRAN) 4 MG tablet Take 1 tablet (4 mg total) by mouth every 8 (eight) hours as needed for nausea or vomiting. Patient not taking: Reported on 01/12/2015 12/30/14   Hedy Jacob, PA-C     Family History  Problem Relation Age of Onset  . Diabetes Mother   . Cancer Mother     breast    Social History   Social History  . Marital Status: Single    Spouse Name: N/A  . Number of Children: N/A  . Years of Education: N/A   Social History Main Topics  . Smoking status: Never Smoker   . Smokeless tobacco: Never Used  . Alcohol Use: No  . Drug Use: No  . Sexual Activity: Yes    Birth Control/ Protection: None   Other Topics Concern  . Not on file   Social History Narrative    Review of Systems: A 12 point ROS discussed and pertinent positives are indicated in the HPI above.  All other systems are negative.  Review of Systems  Vital Signs: BP 140/70 mmHg  Pulse 73  Temp(Src) 98.5 F (36.9 C) (Oral)  Resp 15  SpO2 100%  LMP 12/23/2014  Physical Exam  Constitutional: She is oriented to person, place,  and time. She appears well-developed and well-nourished. No distress.  HENT:  Head: Normocephalic and atraumatic.  Eyes: No scleral icterus.  Cardiovascular: Normal rate and regular rhythm.   Pulmonary/Chest: Effort normal.  Abdominal: Soft. Bowel sounds are normal. She exhibits no distension. There is tenderness. There is no rebound and no guarding.  TTP suprapubic region overlying palpable fibroid uterus  Neurological: She is alert and oriented to person, place, and time.  Skin: Skin is warm and dry.  Psychiatric: She has a normal mood and affect. Her behavior is normal.  Nursing note and vitals reviewed.   Imaging: Ir  Angiogram Pelvis Selective Or Supraselective  12/29/2014  CLINICAL DATA:  49 year old female with symptomatic uterine fibroids. She presents for bilateral uterine artery embolization. EXAM: IR EMBO TUMOR ORGAN ISCHEMIA INFARCT INC GUIDE ROADMAPPING; PELVIC SELECTIVE ARTERIOGRAPHY; IR ULTRASOUND GUIDANCE VASC ACCESS RIGHT; ADDITIONAL ARTERIOGRAPHY Date: 12/29/2014 PROCEDURE: 1. Ultrasound-guided puncture right common femoral artery 2. Catheterization of the left internal iliac artery with arteriogram 3. Catheterization of the inferior rectal artery with arteriogram 4. Catheterization of the uterine artery with arteriogram 5. Particle embolization of the left uterine artery to near stasis 6. Catheterization of the right internal iliac artery with arteriogram 7. Catheterization of the right uterine artery with arteriogram 8. Particle embolization of the right uterine artery to near stasis 9. Limited right common femoral arteriogram 10. Application of Cordis Exoseal device Interventional Radiologist:  Criselda Peaches, MD ANESTHESIA/SEDATION: Moderate (conscious) sedation was used. 1 mg Versed, 0.5 mg Dilaudid were administered intravenously. The patient's vital signs were monitored continuously by radiology nursing throughout the procedure. Sedation Time: 57 minutes MEDICATIONS: 8 mg Zofran, 4 mg Decadron, 1 g Rocephin and 30 mg Toradol administered intravenously FLUOROSCOPY TIME:  19 minutes for a total of 1,376 mGy CONTRAST:  61mL OMNIPAQUE IOHEXOL 300 MG/ML  SOLN TECHNIQUE: Informed consent was obtained from the patient following explanation of the procedure, risks, benefits and alternatives. The patient understands, agrees and consents for the procedure. All questions were addressed. A time out was performed. Maximal barrier sterile technique utilized including caps, mask, sterile gowns, sterile gloves, large sterile drape, hand hygiene, and Betadine skin prep. The right groin was interrogated with ultrasound.  The right common femoral artery was found to be widely patent. An image was obtained and stored for the medical record. Local anesthesia was attained by infiltration with 1% lidocaine. A small dermatotomy was made. Under real-time sonographic guidance, the vessel was punctured with a 21 gauge micropuncture needle. An image was obtained and stored for the medical record. Using standard technique, the initial micro wire was exchanged for a 0.035 inch wire using a 5 [place] transitional micro sheath. The micro sheath was then exchanged for a working 5 [place] vascular sheath. A C2 Cobra catheter was advanced up and over the aortic bifurcation in used to select the left internal iliac artery. An internal iliac arteriogram was performed. The uterine artery is hyper trophic. Multiple round tumor blushes project over the uterus consistent with known multiple uterine fibroids. A high-flow Renegade micro catheter was next advanced over a fat and 16 wire. The micro catheter was initially advanced into an arterial branch. Contrast injection demonstrated that the micro catheter was actually an inferior rectal artery. The micro catheter was brought back into the anterior division of the internal iliac artery and then readvanced this time successfully selecting the origin of the uterine artery. This was confirmed by a hand injection of contrast material. The micro catheter was next advanced  into the horizontal segments of the uterine artery. Angiography demonstrates no visible vesicovaginal branches. Particle embolization was then performed using 1 vial of 500 - 700 micron embospheres followed by 2.75 vials of 700 - 900 micron embospheres. Embolization was taken to near stasis. A post embolization arteriogram confirms significant pruning of the distal vessels and reflux. The micro catheter was removed. The C2 cobra catheter was next formed into a Waltman's loop and used to select the right internal iliac artery. Angiography was  performed demonstrating the origin of the hypertrophic right uterine artery. Similar to the contralateral side, there is tumor blush consistent with multiple right-sided fibroids. There is a large dominant fibroid in the right lateral aspect of the lower uterus. A Renegade high flow micro catheter was next advanced into the horizontal segment of the right uterine artery. Catheter location was confirmed by angiography. No significant vesicovaginal branches distal to the catheter tip. Particle embolization was then performed to near stasis using 1 vial of 500 - 700 micron embospheres followed by 2.25 vials of 700 - 900 micron embospheres. Post embolization arteriography confirms adequate pruning of the vessels and significant reflux. The micro catheter was removed. The Waltman's loop was un formed in the abdominal aorta and an the catheter removed. A limited right common femoral arteriogram was performed confirming common femoral arterial access. Hemostasis was attained with the assistance of a Cordis Exoseal extra arterial collagen plug. COMPLICATIONS: None Estimated blood loss:  0 mL IMPRESSION: Technically successful bilateral uterine artery embolization. Signed, Criselda Peaches, MD [facility] Radiology Electronically Signed   By: Jacqulynn Cadet M.D.   On: 12/29/2014 13:18   Ir Angiogram Pelvis Selective Or Supraselective  12/29/2014  CLINICAL DATA:  49 year old female with symptomatic uterine fibroids. She presents for bilateral uterine artery embolization. EXAM: IR EMBO TUMOR ORGAN ISCHEMIA INFARCT INC GUIDE ROADMAPPING; PELVIC SELECTIVE ARTERIOGRAPHY; IR ULTRASOUND GUIDANCE VASC ACCESS RIGHT; ADDITIONAL ARTERIOGRAPHY Date: 12/29/2014 PROCEDURE: 1. Ultrasound-guided puncture right common femoral artery 2. Catheterization of the left internal iliac artery with arteriogram 3. Catheterization of the inferior rectal artery with arteriogram 4.  Catheterization of the uterine artery with arteriogram 5. Particle embolization of the left uterine artery to near stasis 6. Catheterization of the right internal iliac artery with arteriogram 7. Catheterization of the right uterine artery with arteriogram 8. Particle embolization of the right uterine artery to near stasis 9. Limited right common femoral arteriogram 10. Application of Cordis Exoseal device Interventional Radiologist:  Criselda Peaches, MD ANESTHESIA/SEDATION: Moderate (conscious) sedation was used. 1 mg Versed, 0.5 mg Dilaudid were administered intravenously. The patient's vital signs were monitored continuously by radiology nursing throughout the procedure. Sedation Time: 57 minutes MEDICATIONS: 8 mg Zofran, 4 mg Decadron, 1 g Rocephin and 30 mg Toradol administered intravenously FLUOROSCOPY TIME:  19 minutes for a total of 1,376 mGy CONTRAST:  43mL OMNIPAQUE IOHEXOL 300 MG/ML  SOLN TECHNIQUE: Informed consent was obtained from the patient following explanation of the procedure, risks, benefits and alternatives. The patient understands, agrees and consents for the procedure. All questions were addressed. A time out was performed. Maximal barrier sterile technique utilized including caps, mask, sterile gowns, sterile gloves, large sterile drape, hand hygiene, and Betadine skin prep. The right groin was interrogated with ultrasound. The right common femoral artery was found to be widely patent. An image was obtained and stored for the medical record. Local anesthesia was attained by infiltration with 1% lidocaine. A small dermatotomy was made. Under real-time sonographic guidance,  the vessel was punctured with a 21 gauge micropuncture needle. An image was obtained and stored for the medical record. Using standard technique, the initial micro wire was exchanged for a 0.035 inch wire using a 5 [place] transitional micro sheath. The micro sheath was then exchanged for a working 5 [place] vascular  sheath. A C2 Cobra catheter was advanced up and over the aortic bifurcation in used to select the left internal iliac artery. An internal iliac arteriogram was performed. The uterine artery is hyper trophic. Multiple round tumor blushes project over the uterus consistent with known multiple uterine fibroids. A high-flow Renegade micro catheter was next advanced over a fat and 16 wire. The micro catheter was initially advanced into an arterial branch. Contrast injection demonstrated that the micro catheter was actually an inferior rectal artery. The micro catheter was brought back into the anterior division of the internal iliac artery and then readvanced this time successfully selecting the origin of the uterine artery. This was confirmed by a hand injection of contrast material. The micro catheter was next advanced into the horizontal segments of the uterine artery. Angiography demonstrates no visible vesicovaginal branches. Particle embolization was then performed using 1 vial of 500 - 700 micron embospheres followed by 2.75 vials of 700 - 900 micron embospheres. Embolization was taken to near stasis. A post embolization arteriogram confirms significant pruning of the distal vessels and reflux. The micro catheter was removed. The C2 cobra catheter was next formed into a Waltman's loop and used to select the right internal iliac artery. Angiography was performed demonstrating the origin of the hypertrophic right uterine artery. Similar to the contralateral side, there is tumor blush consistent with multiple right-sided fibroids. There is a large dominant fibroid in the right lateral aspect of the lower uterus. A Renegade high flow micro catheter was next advanced into the horizontal segment of the right uterine artery. Catheter location was confirmed by angiography. No significant vesicovaginal branches distal to the catheter tip. Particle embolization was then performed to near stasis using 1 vial of 500 - 700  micron embospheres followed by 2.25 vials of 700 - 900 micron embospheres. Post embolization arteriography confirms adequate pruning of the vessels and significant reflux. The micro catheter was removed. The Waltman's loop was un formed in the abdominal aorta and an the catheter removed. A limited right common femoral arteriogram was performed confirming common femoral arterial access. Hemostasis was attained with the assistance of a Cordis Exoseal extra arterial collagen plug. COMPLICATIONS: None Estimated blood loss:  0 mL IMPRESSION: Technically successful bilateral uterine artery embolization. Signed, Criselda Peaches, MD [facility] Radiology Electronically Signed   By: Jacqulynn Cadet M.D.   On: 12/29/2014 13:18   Ir Angiogram Selective Each Additional Vessel  12/29/2014  CLINICAL DATA:  49 year old female with symptomatic uterine fibroids. She presents for bilateral uterine artery embolization. EXAM: IR EMBO TUMOR ORGAN ISCHEMIA INFARCT INC GUIDE ROADMAPPING; PELVIC SELECTIVE ARTERIOGRAPHY; IR ULTRASOUND GUIDANCE VASC ACCESS RIGHT; ADDITIONAL ARTERIOGRAPHY Date: 12/29/2014 PROCEDURE: 1. Ultrasound-guided puncture right common femoral artery 2. Catheterization of the left internal iliac artery with arteriogram 3. Catheterization of the inferior rectal artery with arteriogram 4. Catheterization of the uterine artery with arteriogram 5. Particle embolization of the left uterine artery to near stasis 6. Catheterization of the right internal iliac artery with arteriogram 7. Catheterization of the right uterine artery with arteriogram 8. Particle embolization of the right uterine artery to near stasis 9. Limited right common femoral arteriogram 10.  Application of Cordis Exoseal device Interventional Radiologist:  Criselda Peaches, MD ANESTHESIA/SEDATION: Moderate (conscious) sedation was used. 1 mg Versed, 0.5 mg Dilaudid were administered intravenously.  The patient's vital signs were monitored continuously by radiology nursing throughout the procedure. Sedation Time: 57 minutes MEDICATIONS: 8 mg Zofran, 4 mg Decadron, 1 g Rocephin and 30 mg Toradol administered intravenously FLUOROSCOPY TIME:  19 minutes for a total of 1,376 mGy CONTRAST:  1mL OMNIPAQUE IOHEXOL 300 MG/ML  SOLN TECHNIQUE: Informed consent was obtained from the patient following explanation of the procedure, risks, benefits and alternatives. The patient understands, agrees and consents for the procedure. All questions were addressed. A time out was performed. Maximal barrier sterile technique utilized including caps, mask, sterile gowns, sterile gloves, large sterile drape, hand hygiene, and Betadine skin prep. The right groin was interrogated with ultrasound. The right common femoral artery was found to be widely patent. An image was obtained and stored for the medical record. Local anesthesia was attained by infiltration with 1% lidocaine. A small dermatotomy was made. Under real-time sonographic guidance, the vessel was punctured with a 21 gauge micropuncture needle. An image was obtained and stored for the medical record. Using standard technique, the initial micro wire was exchanged for a 0.035 inch wire using a 5 [place] transitional micro sheath. The micro sheath was then exchanged for a working 5 [place] vascular sheath. A C2 Cobra catheter was advanced up and over the aortic bifurcation in used to select the left internal iliac artery. An internal iliac arteriogram was performed. The uterine artery is hyper trophic. Multiple round tumor blushes project over the uterus consistent with known multiple uterine fibroids. A high-flow Renegade micro catheter was next advanced over a fat and 16 wire. The micro catheter was initially advanced into an arterial branch. Contrast injection demonstrated that the micro catheter was actually an inferior rectal artery. The micro catheter was brought back into  the anterior division of the internal iliac artery and then readvanced this time successfully selecting the origin of the uterine artery. This was confirmed by a hand injection of contrast material. The micro catheter was next advanced into the horizontal segments of the uterine artery. Angiography demonstrates no visible vesicovaginal branches. Particle embolization was then performed using 1 vial of 500 - 700 micron embospheres followed by 2.75 vials of 700 - 900 micron embospheres. Embolization was taken to near stasis. A post embolization arteriogram confirms significant pruning of the distal vessels and reflux. The micro catheter was removed. The C2 cobra catheter was next formed into a Waltman's loop and used to select the right internal iliac artery. Angiography was performed demonstrating the origin of the hypertrophic right uterine artery. Similar to the contralateral side, there is tumor blush consistent with multiple right-sided fibroids. There is a large dominant fibroid in the right lateral aspect of the lower uterus. A Renegade high flow micro catheter was next advanced into the horizontal segment of the right uterine artery. Catheter location was confirmed by angiography. No significant vesicovaginal branches distal to the catheter tip. Particle embolization was then performed to near stasis using 1 vial of 500 - 700 micron embospheres followed by 2.25 vials of 700 - 900 micron embospheres. Post embolization arteriography confirms adequate pruning of the vessels and significant reflux. The micro catheter was removed. The Waltman's loop was un formed in the abdominal aorta and an the catheter removed. A limited right common femoral arteriogram was performed confirming common femoral arterial access. Hemostasis was attained with the assistance  of a Cordis Exoseal extra arterial collagen plug. COMPLICATIONS: None Estimated blood loss:  0 mL IMPRESSION: Technically successful bilateral uterine artery  embolization. Signed, Criselda Peaches, MD [facility] Radiology Electronically Signed   By: Jacqulynn Cadet M.D.   On: 12/29/2014 13:18   Ir Angiogram Selective Each Additional Vessel  12/29/2014  CLINICAL DATA:  49 year old female with symptomatic uterine fibroids. She presents for bilateral uterine artery embolization. EXAM: IR EMBO TUMOR ORGAN ISCHEMIA INFARCT INC GUIDE ROADMAPPING; PELVIC SELECTIVE ARTERIOGRAPHY; IR ULTRASOUND GUIDANCE VASC ACCESS RIGHT; ADDITIONAL ARTERIOGRAPHY Date: 12/29/2014 PROCEDURE: 1. Ultrasound-guided puncture right common femoral artery 2. Catheterization of the left internal iliac artery with arteriogram 3. Catheterization of the inferior rectal artery with arteriogram 4. Catheterization of the uterine artery with arteriogram 5. Particle embolization of the left uterine artery to near stasis 6. Catheterization of the right internal iliac artery with arteriogram 7. Catheterization of the right uterine artery with arteriogram 8. Particle embolization of the right uterine artery to near stasis 9. Limited right common femoral arteriogram 10. Application of Cordis Exoseal device Interventional Radiologist:  Criselda Peaches, MD ANESTHESIA/SEDATION: Moderate (conscious) sedation was used. 1 mg Versed, 0.5 mg Dilaudid were administered intravenously. The patient's vital signs were monitored continuously by radiology nursing throughout the procedure. Sedation Time: 57 minutes MEDICATIONS: 8 mg Zofran, 4 mg Decadron, 1 g Rocephin and 30 mg Toradol administered intravenously FLUOROSCOPY TIME:  19 minutes for a total of 1,376 mGy CONTRAST:  29mL OMNIPAQUE IOHEXOL 300 MG/ML  SOLN TECHNIQUE: Informed consent was obtained from the patient following explanation of the procedure, risks, benefits and alternatives. The patient understands, agrees and consents for the procedure. All questions were addressed. A time out was performed. Maximal  barrier sterile technique utilized including caps, mask, sterile gowns, sterile gloves, large sterile drape, hand hygiene, and Betadine skin prep. The right groin was interrogated with ultrasound. The right common femoral artery was found to be widely patent. An image was obtained and stored for the medical record. Local anesthesia was attained by infiltration with 1% lidocaine. A small dermatotomy was made. Under real-time sonographic guidance, the vessel was punctured with a 21 gauge micropuncture needle. An image was obtained and stored for the medical record. Using standard technique, the initial micro wire was exchanged for a 0.035 inch wire using a 5 [place] transitional micro sheath. The micro sheath was then exchanged for a working 5 [place] vascular sheath. A C2 Cobra catheter was advanced up and over the aortic bifurcation in used to select the left internal iliac artery. An internal iliac arteriogram was performed. The uterine artery is hyper trophic. Multiple round tumor blushes project over the uterus consistent with known multiple uterine fibroids. A high-flow Renegade micro catheter was next advanced over a fat and 16 wire. The micro catheter was initially advanced into an arterial branch. Contrast injection demonstrated that the micro catheter was actually an inferior rectal artery. The micro catheter was brought back into the anterior division of the internal iliac artery and then readvanced this time successfully selecting the origin of the uterine artery. This was confirmed by a hand injection of contrast material. The micro catheter was next advanced into the horizontal segments of the uterine artery. Angiography demonstrates no visible vesicovaginal branches. Particle embolization was then performed using 1 vial of 500 - 700 micron embospheres followed by 2.75 vials of 700 - 900 micron embospheres. Embolization was taken to near stasis. A post embolization arteriogram confirms significant pruning  of the distal vessels and reflux. The micro catheter was removed. The C2 cobra catheter was next formed into a Waltman's loop and used to select the right internal iliac artery. Angiography was performed demonstrating the origin of the hypertrophic right uterine artery. Similar to the contralateral side, there is tumor blush consistent with multiple right-sided fibroids. There is a large dominant fibroid in the right lateral aspect of the lower uterus. A Renegade high flow micro catheter was next advanced into the horizontal segment of the right uterine artery. Catheter location was confirmed by angiography. No significant vesicovaginal branches distal to the catheter tip. Particle embolization was then performed to near stasis using 1 vial of 500 - 700 micron embospheres followed by 2.25 vials of 700 - 900 micron embospheres. Post embolization arteriography confirms adequate pruning of the vessels and significant reflux. The micro catheter was removed. The Waltman's loop was un formed in the abdominal aorta and an the catheter removed. A limited right common femoral arteriogram was performed confirming common femoral arterial access. Hemostasis was attained with the assistance of a Cordis Exoseal extra arterial collagen plug. COMPLICATIONS: None Estimated blood loss:  0 mL IMPRESSION: Technically successful bilateral uterine artery embolization. Signed, Criselda Peaches, MD [facility] Radiology Electronically Signed   By: Jacqulynn Cadet M.D.   On: 12/29/2014 13:18   Ir US Guide Vasc Access Right  12/29/2014  CLINICAL DATA:  49 year old female with symptomatic uterine fibroids. She presents for bilateral uterine artery embolization. EXAM: IR EMBO TUMOR ORGAN ISCHEMIA INFARCT INC GUIDE ROADMAPPING; PELVIC SELECTIVE ARTERIOGRAPHY; IR ULTRASOUND GUIDANCE VASC ACCESS RIGHT; ADDITIONAL ARTERIOGRAPHY Date: 12/29/2014 PROCEDURE: 1. Ultrasound-guided puncture right  common femoral artery 2. Catheterization of the left internal iliac artery with arteriogram 3. Catheterization of the inferior rectal artery with arteriogram 4. Catheterization of the uterine artery with arteriogram 5. Particle embolization of the left uterine artery to near stasis 6. Catheterization of the right internal iliac artery with arteriogram 7. Catheterization of the right uterine artery with arteriogram 8. Particle embolization of the right uterine artery to near stasis 9. Limited right common femoral arteriogram 10. Application of Cordis Exoseal device Interventional Radiologist:  Criselda Peaches, MD ANESTHESIA/SEDATION: Moderate (conscious) sedation was used. 1 mg Versed, 0.5 mg Dilaudid were administered intravenously. The patient's vital signs were monitored continuously by radiology nursing throughout the procedure. Sedation Time: 57 minutes MEDICATIONS: 8 mg Zofran, 4 mg Decadron, 1 g Rocephin and 30 mg Toradol administered intravenously FLUOROSCOPY TIME:  19 minutes for a total of 1,376 mGy CONTRAST:  30mL OMNIPAQUE IOHEXOL 300 MG/ML  SOLN TECHNIQUE: Informed consent was obtained from the patient following explanation of the procedure, risks, benefits and alternatives. The patient understands, agrees and consents for the procedure. All questions were addressed. A time out was performed. Maximal barrier sterile technique utilized including caps, mask, sterile gowns, sterile gloves, large sterile drape, hand hygiene, and Betadine skin prep. The right groin was interrogated with ultrasound. The right common femoral artery was found to be widely patent. An image was obtained and stored for the medical record. Local anesthesia was attained by infiltration with 1% lidocaine. A small dermatotomy was made. Under real-time sonographic guidance, the vessel was punctured with a 21 gauge micropuncture needle. An image was obtained and stored for the medical record. Using standard technique, the initial  micro wire was exchanged for a 0.035 inch wire using a 5 [place] transitional micro sheath. The micro sheath was then exchanged for a working 5  [place] vascular sheath. A C2 Cobra catheter was advanced up and over the aortic bifurcation in used to select the left internal iliac artery. An internal iliac arteriogram was performed. The uterine artery is hyper trophic. Multiple round tumor blushes project over the uterus consistent with known multiple uterine fibroids. A high-flow Renegade micro catheter was next advanced over a fat and 16 wire. The micro catheter was initially advanced into an arterial branch. Contrast injection demonstrated that the micro catheter was actually an inferior rectal artery. The micro catheter was brought back into the anterior division of the internal iliac artery and then readvanced this time successfully selecting the origin of the uterine artery. This was confirmed by a hand injection of contrast material. The micro catheter was next advanced into the horizontal segments of the uterine artery. Angiography demonstrates no visible vesicovaginal branches. Particle embolization was then performed using 1 vial of 500 - 700 micron embospheres followed by 2.75 vials of 700 - 900 micron embospheres. Embolization was taken to near stasis. A post embolization arteriogram confirms significant pruning of the distal vessels and reflux. The micro catheter was removed. The C2 cobra catheter was next formed into a Waltman's loop and used to select the right internal iliac artery. Angiography was performed demonstrating the origin of the hypertrophic right uterine artery. Similar to the contralateral side, there is tumor blush consistent with multiple right-sided fibroids. There is a large dominant fibroid in the right lateral aspect of the lower uterus. A Renegade high flow micro catheter was next advanced into the horizontal segment of the right uterine artery. Catheter location was confirmed by  angiography. No significant vesicovaginal branches distal to the catheter tip. Particle embolization was then performed to near stasis using 1 vial of 500 - 700 micron embospheres followed by 2.25 vials of 700 - 900 micron embospheres. Post embolization arteriography confirms adequate pruning of the vessels and significant reflux. The micro catheter was removed. The Waltman's loop was un formed in the abdominal aorta and an the catheter removed. A limited right common femoral arteriogram was performed confirming common femoral arterial access. Hemostasis was attained with the assistance of a Cordis Exoseal extra arterial collagen plug. COMPLICATIONS: None Estimated blood loss:  0 mL IMPRESSION: Technically successful bilateral uterine artery embolization. Signed, Criselda Peaches, MD [facility] Radiology Electronically Signed   By: Jacqulynn Cadet M.D.   On: 12/29/2014 13:18   Ir Embo Tumor Organ Ischemia Infarct Inc Guide Roadmapping  12/29/2014  CLINICAL DATA:  49 year old female with symptomatic uterine fibroids. She presents for bilateral uterine artery embolization. EXAM: IR EMBO TUMOR ORGAN ISCHEMIA INFARCT INC GUIDE ROADMAPPING; PELVIC SELECTIVE ARTERIOGRAPHY; IR ULTRASOUND GUIDANCE VASC ACCESS RIGHT; ADDITIONAL ARTERIOGRAPHY Date: 12/29/2014 PROCEDURE: 1. Ultrasound-guided puncture right common femoral artery 2. Catheterization of the left internal iliac artery with arteriogram 3. Catheterization of the inferior rectal artery with arteriogram 4. Catheterization of the uterine artery with arteriogram 5. Particle embolization of the left uterine artery to near stasis 6. Catheterization of the right internal iliac artery with arteriogram 7. Catheterization of the right uterine artery with arteriogram 8. Particle embolization of the right uterine artery to near stasis 9. Limited right common femoral arteriogram 10. Application of Cordis Exoseal device  Interventional Radiologist:  Criselda Peaches, MD ANESTHESIA/SEDATION: Moderate (conscious) sedation was used. 1 mg Versed, 0.5 mg Dilaudid were administered intravenously. The patient's vital signs were monitored continuously by radiology nursing throughout the procedure. Sedation Time: 57 minutes MEDICATIONS: 8 mg Zofran,  4 mg Decadron, 1 g Rocephin and 30 mg Toradol administered intravenously FLUOROSCOPY TIME:  19 minutes for a total of 1,376 mGy CONTRAST:  97mL OMNIPAQUE IOHEXOL 300 MG/ML  SOLN TECHNIQUE: Informed consent was obtained from the patient following explanation of the procedure, risks, benefits and alternatives. The patient understands, agrees and consents for the procedure. All questions were addressed. A time out was performed. Maximal barrier sterile technique utilized including caps, mask, sterile gowns, sterile gloves, large sterile drape, hand hygiene, and Betadine skin prep. The right groin was interrogated with ultrasound. The right common femoral artery was found to be widely patent. An image was obtained and stored for the medical record. Local anesthesia was attained by infiltration with 1% lidocaine. A small dermatotomy was made. Under real-time sonographic guidance, the vessel was punctured with a 21 gauge micropuncture needle. An image was obtained and stored for the medical record. Using standard technique, the initial micro wire was exchanged for a 0.035 inch wire using a 5 [place] transitional micro sheath. The micro sheath was then exchanged for a working 5 [place] vascular sheath. A C2 Cobra catheter was advanced up and over the aortic bifurcation in used to select the left internal iliac artery. An internal iliac arteriogram was performed. The uterine artery is hyper trophic. Multiple round tumor blushes project over the uterus consistent with known multiple uterine fibroids. A high-flow Renegade micro catheter was next advanced over a fat and 16 wire. The micro catheter was  initially advanced into an arterial branch. Contrast injection demonstrated that the micro catheter was actually an inferior rectal artery. The micro catheter was brought back into the anterior division of the internal iliac artery and then readvanced this time successfully selecting the origin of the uterine artery. This was confirmed by a hand injection of contrast material. The micro catheter was next advanced into the horizontal segments of the uterine artery. Angiography demonstrates no visible vesicovaginal branches. Particle embolization was then performed using 1 vial of 500 - 700 micron embospheres followed by 2.75 vials of 700 - 900 micron embospheres. Embolization was taken to near stasis. A post embolization arteriogram confirms significant pruning of the distal vessels and reflux. The micro catheter was removed. The C2 cobra catheter was next formed into a Waltman's loop and used to select the right internal iliac artery. Angiography was performed demonstrating the origin of the hypertrophic right uterine artery. Similar to the contralateral side, there is tumor blush consistent with multiple right-sided fibroids. There is a large dominant fibroid in the right lateral aspect of the lower uterus. A Renegade high flow micro catheter was next advanced into the horizontal segment of the right uterine artery. Catheter location was confirmed by angiography. No significant vesicovaginal branches distal to the catheter tip. Particle embolization was then performed to near stasis using 1 vial of 500 - 700 micron embospheres followed by 2.25 vials of 700 - 900 micron embospheres. Post embolization arteriography confirms adequate pruning of the vessels and significant reflux. The micro catheter was removed. The Waltman's loop was un formed in the abdominal aorta and an the catheter removed. A limited right common femoral arteriogram was performed confirming common femoral arterial access. Hemostasis was attained  with the assistance of a Cordis Exoseal extra arterial collagen plug. COMPLICATIONS: None Estimated blood loss:  0 mL IMPRESSION: Technically successful bilateral uterine artery embolization. Signed, Criselda Peaches, MD [facility] Radiology Electronically Signed   By: Jacqulynn Cadet M.D.   On: 12/29/2014  13:18    Labs:  CBC:  Recent Labs  07/01/14 1009 12/29/14 0820  WBC 9.0 4.2  HGB 13.9 13.7  HCT 42.8 42.0  PLT 210 209    COAGS:  Recent Labs  07/01/14 1010 12/29/14 0820  INR 0.99 1.00  APTT  --  29    BMP:  Recent Labs  07/01/14 1009 09/09/14 0935 12/29/14 0820  NA 138 138 138  K 4.6 4.5 4.4  CL 105 105 107  CO2 28 26 25   GLUCOSE 77 63* 96  BUN 11 14 17   CALCIUM 7.9* 8.6 8.6*  CREATININE 0.87 0.94 1.08*  GFRNONAA  --   --  58*  GFRAA  --   --  >60    LIVER FUNCTION TESTS:  Recent Labs  07/01/14 1009 09/09/14 0935 12/29/14 0820  BILITOT 0.4 0.3 0.5  AST 21 17 40  ALT 18 13 25   ALKPHOS 41 44 38  PROT 5.4* 5.9* 6.4*  ALBUMIN 3.2* 3.3* 3.4*    TUMOR MARKERS: No results for input(s): AFPTM, CEA, CA199, CHROMGRNA in the last 8760 hours.  Assessment and Plan:  Doing well 2 weeks status post bilateral uterine artery embolization for symptomatic uterine fibroids. She is experiencing expected post procedural symptoms with persistent occasional lower abdominal and pelvic pain which is responding well to ibuprofen.  1.) Continue ibuprofen 600 mg every 6 hours as needed for pain  2.) Recommend beginning and over the counter probiotic  3.) Nursing call to check in at 3 months post procedure followed by next clinic visit at 6 months postprocedure.   SignedJacqulynn Cadet 01/12/2015, 5:06 PM   I spent a total of  15 Minutes in face to face in clinical consultation, greater than 50% of which was counseling/coordinating care for symptomatic uterine fibroids.

## 2015-01-27 ENCOUNTER — Telehealth: Payer: Self-pay | Admitting: Radiology

## 2015-01-27 NOTE — Telephone Encounter (Signed)
1 mo s/p [place].  Patient called with concern of abdominal cramping.  LMP:  01/25/15.  Afebrile.  Denies urinary sx.  Normal bowel pattern.  States that she is "just concerned as to if this is ok".   Reassured patient that cramping can occur for 4-6 weeks post [place] or that cramping may be the result of menstrual cycle.  Instructed patient to call back for concerns/questions or if she wants to schedule a follow up appointment with Dr Laurence Ferrari at this time.  She states that she prefers to wait a few more days and will call back if needed.  Champayne Kocian Riki Rusk, RN 01/27/2015 2:26 PM

## 2015-02-04 ENCOUNTER — Telehealth: Payer: Self-pay | Admitting: Radiology

## 2015-02-04 NOTE — Telephone Encounter (Signed)
Medication refill called to [place] for Ibuprofen 600 mg po q 6 hrs prn x 30 tabs per Dr Jacqulynn Cadet.  Patient notified.  Breydon Senters Riki Rusk, RN 02/04/2015 1:18 PM

## 2015-04-13 ENCOUNTER — Telehealth: Payer: Self-pay | Admitting: Radiology

## 2015-04-13 NOTE — Telephone Encounter (Signed)
3 mo follow up [place] call for status update.  Patient states that the bloating is unchanged post [place], continues to experience abd pressure.  She also does not feel that her uterus has decreased in size.  Menstrual flow has decreased.    Patient prefers to schedule follow up at 6 mos post [place] and has requested for and MR Pelvis at that time.  Finnegan Gatta Riki Rusk, [place] 04/13/2015 2:17 PM

## 2015-05-26 ENCOUNTER — Other Ambulatory Visit (HOSPITAL_COMMUNITY): Payer: Self-pay | Admitting: Interventional Radiology

## 2015-05-26 DIAGNOSIS — D259 Leiomyoma of uterus, unspecified: Secondary | ICD-10-CM

## 2015-06-21 ENCOUNTER — Other Ambulatory Visit (HOSPITAL_COMMUNITY): Payer: Self-pay | Admitting: Interventional Radiology

## 2015-07-07 ENCOUNTER — Other Ambulatory Visit (HOSPITAL_COMMUNITY): Payer: Self-pay | Admitting: Interventional Radiology

## 2015-07-14 ENCOUNTER — Ambulatory Visit (HOSPITAL_COMMUNITY): Admission: RE | Admit: 2015-07-14 | Payer: BLUE CROSS/BLUE SHIELD | Source: Ambulatory Visit

## 2015-07-14 ENCOUNTER — Other Ambulatory Visit: Payer: Self-pay

## 2015-09-08 ENCOUNTER — Encounter: Payer: BLUE CROSS/BLUE SHIELD | Admitting: Family Medicine

## 2016-05-25 ENCOUNTER — Ambulatory Visit: Payer: Self-pay

## 2016-05-31 ENCOUNTER — Telehealth: Payer: Self-pay | Admitting: Family Medicine

## 2016-05-31 DIAGNOSIS — Z0000 Encounter for general adult medical examination without abnormal findings: Secondary | ICD-10-CM

## 2016-05-31 DIAGNOSIS — M25579 Pain in unspecified ankle and joints of unspecified foot: Secondary | ICD-10-CM

## 2016-05-31 NOTE — Telephone Encounter (Signed)
Talked with TIa Done Dorcas Mcmurray -

## 2016-05-31 NOTE — Telephone Encounter (Signed)
Pt called because she would like to get a referral to the dentist and a foot doctor. She has the orange card and waiting to see if she qualifies for FA. jw

## 2016-06-28 ENCOUNTER — Encounter: Payer: Self-pay | Admitting: Family Medicine

## 2016-06-28 ENCOUNTER — Ambulatory Visit (INDEPENDENT_AMBULATORY_CARE_PROVIDER_SITE_OTHER): Payer: Self-pay | Admitting: Family Medicine

## 2016-06-28 VITALS — BP 156/90 | HR 57 | Temp 98.4°F | Ht 63.0 in | Wt 198.4 lb

## 2016-06-28 DIAGNOSIS — K439 Ventral hernia without obstruction or gangrene: Secondary | ICD-10-CM

## 2016-06-28 DIAGNOSIS — Z9889 Other specified postprocedural states: Secondary | ICD-10-CM

## 2016-06-28 DIAGNOSIS — D252 Subserosal leiomyoma of uterus: Secondary | ICD-10-CM

## 2016-06-28 DIAGNOSIS — Z0000 Encounter for general adult medical examination without abnormal findings: Secondary | ICD-10-CM

## 2016-06-28 DIAGNOSIS — I10 Essential (primary) hypertension: Secondary | ICD-10-CM

## 2016-06-28 MED ORDER — LISINOPRIL-HYDROCHLOROTHIAZIDE 20-25 MG PO TABS: 1.0000 | ORAL_TABLET | Freq: Every day | ORAL | 3 refills | Status: DC

## 2016-06-28 NOTE — Assessment & Plan Note (Signed)
Elevated blood pressure on multiple occasions. Lower strandy swelling. We'll start her on lisinoretic. rtc 4 w.

## 2016-06-28 NOTE — Progress Notes (Signed)
    CHIEF COMPLAINT / HPI:   Check up Specifically issues to be addressed: Foot swelling. Constant. Lessens a little bit over the night but is present almost all the time. She tried some compression hose which did not seem to help much #2. Elevated blood pressure on several occasions recently. Denies headache. No chest pain. No shortness of breath. No change in exercise tolerance #3. Abdominal hernia: She reports history of abdominal hernia and says that she really wants to get this fixed because it makes her look "pregnant". Neck the #4. Preventive medicine questions. She's a little uncomfortable awaiting to get up Pap smear until next year. Wants to know if the guidelines are safe #4. Foot pain/plantar fasciitis. Long-term. Hurts her first step in the morning if she's been sitting for a while. Has tried to buy better shoes etc. with little improvement.  REVIEW OF SYSTEMS:  Review of Systems  Constitutional: Negative for activity chang; no  appetite change and no unexpected weight change.  Eyes: Negative for eye pain and no visual disturbance.  Neck: denies neck pain; no swallowing problems CV: No chest pain, no shortness of breath, no lower extremity edema. No change in exercise tolerance Respiratory: Negative for cough or wheezing.  No shortness of breath. Gastrointestinal: Negative for abdominal pain, no diarrhea and no  constipation.  Genitourinary: Negative for decreased urine volume and  no difficulty urinating.  Musculoskeletal: Negative for arthralgias. No muscle weakness. Skin: Negative for rash.  Psychiatric/Behavioral: Negative for behavioral problems; no sleep disturbance and no  agitation.     OBJECTIVE:  Vital signs are reviewed.   Vital signs reviewed GENERALl: Well developed, well nourished, in no acute distress. HEENT: PERRLA, EOMI, sclerae are nonicteric NECK: Supple, FROM, without lymphadenopathy.  THYROID: normal without nodularity CAROTID ARTERIES: without  bruits LUNGS: clear to auscultation bilaterally. No wheezes or rales. Normal respiratory effort HEART: Regular rate and rhythm, no murmurs. Distal pulses are bilaterally symmetrical, 2+. ABDOMEN: soft with positive bowel sounds. No masses noted MSK: MOE x 4. Normal muscle strength, bulk and tone. SKIN no rash. Normal temperature. NEURO: no focal deficits. Normal gait. Normal balance.   ASSESSMENT / PLAN: Please see problem oriented charting for details

## 2016-06-28 NOTE — Patient Instructions (Signed)
I am starting you on a new medicine for your blood pressure and swelling. I agree with lowering your salt some.  Take your blood pressure pill EVERY DAY, SAME TIME A DAY IF POSSIBLE.   Let me see you in a month I will send you a note about your blood work  You need to get your colonoscopy and your mammogram Your pap is not due until NEXT YEAR

## 2016-06-28 NOTE — Assessment & Plan Note (Signed)
Pap smear due next year. Gave her information on mammogram and colonoscopy. We've previously set up a colonoscopy for her but she did not go. Check cholesterol today. Discussed weight management and exercise.

## 2016-06-28 NOTE — Assessment & Plan Note (Addendum)
I looked back through her chart and can't find any imaging that shows abdominal wall hernia. She seems convinced that she has 1. My exam is also inconsistent with significant hernia. She does have some abdominal wall fat and I think this is what is bothering her. We decided to wait till next office visit to discuss this again. She really wants some type of surgery to "fix this". Notably she's gained about 25 pounds in the last couple of years so I think it is weight gain rather than specific abdominal wall hernia causing her issues. She says it makes her "pregnant".

## 2016-06-28 NOTE — Assessment & Plan Note (Deleted)
She reports history of abdominal wall hernia. I do not see it described on any CT imaging. On exam, I am not able to detect specific abdominal wall defect area

## 2016-06-29 LAB — BASIC METABOLIC PANEL
BUN / CREAT RATIO: 20 (ref 9–23)
BUN: 19 mg/dL (ref 6–24)
CALCIUM: 9.2 mg/dL (ref 8.7–10.2)
CHLORIDE: 103 mmol/L (ref 96–106)
CO2: 21 mmol/L (ref 18–29)
CREATININE: 0.93 mg/dL (ref 0.57–1.00)
GFR calc Af Amer: 81 mL/min/{1.73_m2} (ref >59)
GFR calc non Af Amer: 70 mL/min/{1.73_m2} (ref >59)
GLUCOSE: 62 mg/dL — AB (ref 65–99)
Potassium: 4.8 mmol/L (ref 3.5–5.2)
Sodium: 139 mmol/L (ref 134–144)

## 2016-06-29 LAB — LDL CHOLESTEROL, DIRECT: LDL DIRECT: 144 mg/dL — AB (ref 0–99)

## 2016-06-30 ENCOUNTER — Encounter: Payer: Self-pay | Admitting: Family Medicine

## 2016-07-03 ENCOUNTER — Ambulatory Visit: Payer: Self-pay

## 2016-07-21 ENCOUNTER — Telehealth: Payer: Self-pay | Admitting: Family Medicine

## 2016-07-21 DIAGNOSIS — Z1211 Encounter for screening for malignant neoplasm of colon: Secondary | ICD-10-CM

## 2016-07-21 DIAGNOSIS — K029 Dental caries, unspecified: Secondary | ICD-10-CM

## 2016-07-21 DIAGNOSIS — K0889 Other specified disorders of teeth and supporting structures: Secondary | ICD-10-CM

## 2016-07-21 NOTE — Telephone Encounter (Signed)
Pt needs a colonoscopy. Pt also needs a referral to the dentist, pt has a tooth that is split in half. ep

## 2016-07-25 ENCOUNTER — Encounter: Payer: Self-pay | Admitting: Gastroenterology

## 2016-07-26 ENCOUNTER — Ambulatory Visit: Payer: Self-pay | Admitting: Family Medicine

## 2016-08-08 ENCOUNTER — Ambulatory Visit: Payer: Self-pay | Admitting: *Deleted

## 2016-08-08 VITALS — Ht 64.0 in | Wt 199.4 lb

## 2016-08-08 DIAGNOSIS — Z1211 Encounter for screening for malignant neoplasm of colon: Secondary | ICD-10-CM

## 2016-08-08 MED ORDER — SUPREP BOWEL PREP KIT 17.5-3.13-1.6 GM/177ML PO SOLN: 1.0000 | Freq: Once | ORAL | 0 refills | Status: AC

## 2016-08-08 NOTE — Progress Notes (Signed)
Patient denies any allergies to egg or soy products. Patient denies complications with anesthesia/sedation.  Patient denies oxygen use at home and denies diet medications. Emmi instructions for colonoscopy denied.  Pamphlet given.   Suprep sample given to patient - orange card verified.

## 2016-08-09 ENCOUNTER — Ambulatory Visit: Payer: Self-pay | Admitting: Family Medicine

## 2016-08-18 ENCOUNTER — Encounter: Payer: Self-pay | Admitting: Gastroenterology

## 2016-08-18 ENCOUNTER — Ambulatory Visit (AMBULATORY_SURGERY_CENTER): Payer: Self-pay | Admitting: Gastroenterology

## 2016-08-18 VITALS — BP 129/76 | HR 58 | Temp 97.1°F | Resp 16 | Ht 64.0 in | Wt 199.0 lb

## 2016-08-18 DIAGNOSIS — Z1211 Encounter for screening for malignant neoplasm of colon: Secondary | ICD-10-CM

## 2016-08-18 DIAGNOSIS — Z1212 Encounter for screening for malignant neoplasm of rectum: Secondary | ICD-10-CM

## 2016-08-18 MED ORDER — SODIUM CHLORIDE 0.9 % IV SOLN: 500.0000 mL | INTRAVENOUS | Status: DC

## 2016-08-18 NOTE — Progress Notes (Signed)
Pt asked if ok to give plasma tomorrow.  Per Dr. Silverio Decamp, okay if pt feels back to normal.  Message given to pt.  Pt's has an abdominal hernia.  Her abdomen was soft on discharge.  maw

## 2016-08-18 NOTE — Progress Notes (Signed)
Report to PACU, RN, vss, BBS= Clear.  

## 2016-08-18 NOTE — Progress Notes (Signed)
No problems noted in the recovery room. maw 

## 2016-08-18 NOTE — Patient Instructions (Signed)
YOU HAD AN ENDOSCOPIC PROCEDURE TODAY AT [facility]:   Refer to the procedure report that was given to you for any specific questions about what was found during the examination.  If the procedure report does not answer your questions, please call your gastroenterologist to clarify.  If you requested that your care partner not be given the details of your procedure findings, then the procedure report has been included in a sealed envelope for you to review at your convenience later.  YOU SHOULD EXPECT: Some feelings of bloating in the abdomen. Passage of more gas than usual.  Walking can help get rid of the air that was put into your GI tract during the procedure and reduce the bloating. If you had a lower endoscopy (such as a colonoscopy or flexible sigmoidoscopy) you may notice spotting of blood in your stool or on the toilet paper. If you underwent a bowel prep for your procedure, you may not have a normal bowel movement for a few days.  Please Note:  You might notice some irritation and congestion in your nose or some drainage.  This is from the oxygen used during your procedure.  There is no need for concern and it should clear up in a day or so.  SYMPTOMS TO REPORT IMMEDIATELY:   Following lower endoscopy (colonoscopy or flexible sigmoidoscopy):  Excessive amounts of blood in the stool  Significant tenderness or worsening of abdominal pains  Swelling of the abdomen that is new, acute  Fever of 100F or higher     For urgent or emergent issues, a gastroenterologist can be reached at any hour by calling [phone].   DIET:  We do recommend a small meal at first, but then you may proceed to your regular diet.  Drink plenty of fluids but you should avoid alcoholic beverages for 24 hours.  ACTIVITY:  You should plan to take it easy for the rest of today and you should NOT DRIVE or use heavy machinery until tomorrow (because of the sedation medicines used during the  test).    FOLLOW UP: Our staff will call the number listed on your records the next business day following your procedure to check on you and address any questions or concerns that you may have regarding the information given to you following your procedure. If we do not reach you, we will leave a message.  However, if you are feeling well and you are not experiencing any problems, there is no need to return our call.  We will assume that you have returned to your regular daily activities without incident.  If any biopsies were taken you will be contacted by phone or by letter within the next 1-3 weeks.  Please call us at [phone] if you have not heard about the biopsies in 3 weeks.    SIGNATURES/CONFIDENTIALITY: You and/or your care partner have signed paperwork which will be entered into your electronic medical record.  These signatures attest to the fact that that the information above on your After Visit Summary has been reviewed and is understood.  Full responsibility of the confidentiality of this discharge information lies with you and/or your care-partner.    Handout was given to your care partner on hemorrhoids. You may resume your current medications today. Next routine colonoscopy in 10 years. Please call if any questions or concerns.

## 2016-08-18 NOTE — Op Note (Signed)
[facility] Patient Name: Carrie Roth Procedure Date: 08/18/2016 2:22 PM MRN: [id number] Endoscopist: Mauri Pole , MD Age: 51 Referring MD:  Date of Birth: 1965-04-27 Gender: Female Account #: [id number] Procedure:                Colonoscopy Indications:              Screening for colorectal malignant neoplasm, This                            is the patient's first colonoscopy Medicines:                Monitored Anesthesia Care Procedure:                Pre-Anesthesia Assessment:                           - Prior to the procedure, a History and Physical                            was performed, and patient medications and                            allergies were reviewed. The patient's tolerance of                            previous anesthesia was also reviewed. The risks                            and benefits of the procedure and the sedation                            options and risks were discussed with the patient.                            All questions were answered, and informed consent                            was obtained. Prior Anticoagulants: The patient has                            taken no previous anticoagulant or antiplatelet                            agents. ASA Grade Assessment: II - A patient with                            mild systemic disease. After reviewing the risks                            and benefits, the patient was deemed in                            satisfactory condition to undergo the procedure.                           -  Prior to the procedure, a History and Physical                            was performed, and patient medications and                            allergies were reviewed. The patient's tolerance of                            previous anesthesia was also reviewed. The risks                            and benefits of the procedure and the sedation                            options and risks were discussed  with the patient.                            All questions were answered, and informed consent                            was obtained. Prior Anticoagulants: The patient has                            taken no previous anticoagulant or antiplatelet                            agents. ASA Grade Assessment: II - A patient with                            mild systemic disease. After reviewing the risks                            and benefits, the patient was deemed in                            satisfactory condition to undergo the procedure.                           After obtaining informed consent, the colonoscope                            was passed under direct vision. Throughout the                            procedure, the patient's blood pressure, pulse, and                            oxygen saturations were monitored continuously. The                            Model CF-HQ190L [id number]) scope was introduced  through the anus and advanced to the the cecum,                            identified by appendiceal orifice and ileocecal                            valve. The colonoscopy was performed without                            difficulty. The patient tolerated the procedure                            well. The quality of the bowel preparation was                            excellent. The ileocecal valve, appendiceal                            orifice, and rectum were photographed. Scope In: 2:24:16 PM Scope Out: 2:46:02 PM Scope Withdrawal Time: 0 hours 15 minutes 27 seconds  Total Procedure Duration: 0 hours 21 minutes 46 seconds  Findings:                 The perianal and digital rectal examinations were                            normal.                           Non-bleeding internal hemorrhoids were found during                            retroflexion. The hemorrhoids were small.                           The exam was otherwise without  abnormality. Complications:            No immediate complications. Estimated Blood Loss:     Estimated blood loss: none. Impression:               - Non-bleeding internal hemorrhoids.                           - The examination was otherwise normal.                           - No specimens collected. Recommendation:           - Patient has a contact number available for                            emergencies. The signs and symptoms of potential                            delayed complications were discussed with the  patient. Return to normal activities tomorrow.                            Written discharge instructions were provided to the                            patient.                           - Resume previous diet.                           - Continue present medications.                           - Repeat colonoscopy in 10 years for screening                            purposes. Mauri Pole, MD 08/18/2016 2:49:22 PM This report has been signed electronically.

## 2016-08-21 ENCOUNTER — Telehealth: Payer: Self-pay

## 2016-08-21 NOTE — Telephone Encounter (Signed)
  Follow up Call-  Call back number 08/18/2016  Post procedure Call Back phone  # [phone]  Permission to leave phone message Yes  Some recent data might be hidden     Patient questions:  Do you have a fever, pain , or abdominal swelling? No. Pain Score  0 *  Have you tolerated food without any problems? Yes.    Have you been able to return to your normal activities? Yes.    Do you have any questions about your discharge instructions: Diet   No. Medications  No. Follow up visit  No.  Do you have questions or concerns about your Care? No.  Actions: * If pain score is 4 or above: No action needed, pain <4.  Pt thanked [place] for "the great care'.  No problems noted per pt. maw

## 2016-08-30 ENCOUNTER — Ambulatory Visit: Payer: Self-pay | Admitting: Family Medicine

## 2016-09-04 ENCOUNTER — Other Ambulatory Visit: Payer: Self-pay | Admitting: Family Medicine

## 2016-09-06 ENCOUNTER — Ambulatory Visit (INDEPENDENT_AMBULATORY_CARE_PROVIDER_SITE_OTHER): Payer: Self-pay | Admitting: Family Medicine

## 2016-09-06 ENCOUNTER — Encounter: Payer: Self-pay | Admitting: Family Medicine

## 2016-09-06 DIAGNOSIS — I10 Essential (primary) hypertension: Secondary | ICD-10-CM

## 2016-09-08 NOTE — Progress Notes (Signed)
    CHIEF COMPLAINT / HPI: #1. Hypertension: Follow-up starting was sent a lisinopril plus HCTZ. Not having any problems with it. Has been checking her blood pressure and they're much improved. No headaches, no cough. #2. Had her colonoscopy done. Is scheduled to get her mammogram done. #3. Has questions about how often she can donate plasma which she does is or twice a week most weeks for financial reasons #4. Exercise: She started back exercising   REVIEW OF SYSTEMS:  Denies chest pain. Has lost a few pounds intentionally. See history of present illness.  OBJECTIVE:  Vital signs are reviewed.   Vital signs reviewed. GENERAL: Well-developed, well-nourished, no acute distress. CARDIOVASCULAR: Regular rate and rhythm no murmur gallop or rub LUNGS: Clear to auscultation bilaterally, no rales or wheeze. ABDOMEN: Soft positive bowel sounds NEURO: No gross focal neurological deficits. MSK: Movement of extremity x 4.    ASSESSMENT / PLAN: Please see problem oriented charting for details

## 2016-09-08 NOTE — Assessment & Plan Note (Signed)
Doing well on current medication therapy so we will continue that.

## 2017-02-28 ENCOUNTER — Ambulatory Visit: Payer: Self-pay

## 2017-03-07 ENCOUNTER — Ambulatory Visit: Payer: Self-pay

## 2017-05-09 ENCOUNTER — Ambulatory Visit: Payer: Self-pay | Admitting: Family Medicine

## 2017-05-28 ENCOUNTER — Encounter: Payer: Self-pay | Admitting: Family Medicine

## 2017-05-28 ENCOUNTER — Ambulatory Visit (INDEPENDENT_AMBULATORY_CARE_PROVIDER_SITE_OTHER): Payer: Self-pay | Admitting: Family Medicine

## 2017-05-28 ENCOUNTER — Other Ambulatory Visit: Payer: Self-pay

## 2017-05-28 ENCOUNTER — Telehealth: Payer: Self-pay | Admitting: Family Medicine

## 2017-05-28 VITALS — BP 110/80 | HR 64 | Temp 98.5°F | Wt 210.0 lb

## 2017-05-28 DIAGNOSIS — L237 Allergic contact dermatitis due to plants, except food: Secondary | ICD-10-CM

## 2017-05-28 MED ORDER — PREDNISONE 20 MG PO TABS: ORAL_TABLET | ORAL | 0 refills | Status: AC

## 2017-05-28 NOTE — Progress Notes (Signed)
   Subjective:    Patient ID: Carrie Roth , female   DOB: 1965/02/04 , 52 y.o..   MRN: [id number]  HPI  Dai Schley is here for   1. RASH  Had rash for 2  days. Location: on right temple and forehead. R eye is swollen Medications tried: homeopathic Rhus tox, ibuprofen, benadryl Similar rash in past: no Patient believes may be caused by poison ivy because it occurred after she was gardening  New medications or antibiotics: No Tick, Insect or new pet exposure: No Recent travel: No New detergent or soap: No Immunocompromised: No  Symptoms Itching: Yes Pain over rash: No Feeling ill all over: No Fever: No Mouth sores: No Face or tongue swelling: No Trouble breathing: No Joint swelling or pain: No  Review of Symptoms - see HPI PMH - Smoking status noted.    Past Medical History: Patient Active Problem List   Diagnosis Date Noted  . Essential hypertension 06/28/2016  . 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 Subserous leiomyoma of uterus   . Hernia of abdominal wall 01/14/2014  . Bradycardia 01/14/2014  . Well adult exam 06/25/2013  . 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 BREAST MASS, BENIGN 12/24/2006  . DISORDER, SOMATIZATION 04/30/2006    Medications: reviewed   Social Hx:  reports that she has never smoked. She has never used smokeless tobacco.   Objective:   BP 110/80   Pulse 64   Temp 98.5 F (36.9 C) (Oral)   Wt 210 lb (95.3 kg)   LMP 04/13/2017   SpO2 98%   BMI 36.05 kg/m  Physical Exam  Gen: NAD, alert, cooperative with exam, well-appearing HEENT: NCAT, PERRL, clear conjunctiva, oropharynx clear, supple neck Cardiac: Regular rate and rhythm, normal S1/S2, no murmur, no edema, capillary refill brisk  Respiratory: Clear to auscultation bilaterally, no wheezes, non-labored breathing Skin: Erythematous papular rash, linear appearing on right temple and across forehead.  Right forehead has an area of crusting.  Scant amount of  erythematous papules on left neck and left forearm. Psych: good insight, normal mood and affect      Assessment & Plan:   1. Allergic dermatitis due to poison ivy: History and exam most consistent with poison ivy allergic dermatitis.  Primarily on forehead and right temple which subsequently caused some periorbital edema of the right eye.  The eye itself appears normal.  There are a few papules in other areas including the back of the left neck and the left forearm however they are less severe.  Less likely herpes zoster given the distribution of rash although this was in the differential - predniSONE (DELTASONE) 20 MG tablet; Take 3 tablets (60 mg total) by mouth daily with breakfast for 7 days, THEN 2 tablets (40 mg total) daily with breakfast for 7 days, THEN 1 tablet (20 mg total) daily with breakfast for 7 days.  Dispense: 42 tablet; Refill: 0 -Return and ED precautions discussed  Smitty Cords, MD [place], PGY-3

## 2017-05-28 NOTE — Telephone Encounter (Signed)
Pt wants a Rx for 800mg  ibuprofen,  [facility] on [facility]

## 2017-05-28 NOTE — Patient Instructions (Signed)
Poison Ivy Dermatitis Poison ivy dermatitis is redness and soreness (inflammation) of the skin. It is caused by a chemical that is found on the leaves of the poison ivy plant. You may also have itching, a rash, and blisters. Symptoms often clear up in 1-2 weeks. You may get this condition by touching a poison ivy plant. You can also get it by touching something that has the chemical on it. This may include animals or objects that have come in contact with the plant. Follow these instructions at home: General instructions  Take or apply over-the-counter and prescription medicines only as told by your doctor.  If you touch poison ivy, wash your skin with soap and cold water right away.  Use hydrocortisone creams or calamine lotion as needed to help with itching.  Take oatmeal baths as needed. Use colloidal oatmeal. You can get this at a pharmacy or grocery store. Follow the instructions on the package.  Do not scratch or rub your skin.  While you have the rash, wash your clothes right after you wear them. Prevention  Know what poison ivy looks like so you can avoid it. This plant has three leaves with flowering branches on a single stem. The leaves are glossy. They have uneven edges that come to a point at the front.  If you have touched poison ivy, wash with soap and water right away. Be sure to wash under your fingernails.  When hiking or camping, wear long pants, a long-sleeved shirt, tall socks, and hiking boots. You can also use a lotion on your skin that helps to prevent contact with the chemical on the plant.  If you think that your clothes or outdoor gear came in contact with poison ivy, rinse them off with a garden hose before you bring them inside your house. Contact a doctor if:  You have open sores in the rash area.  You have more redness, swelling, or pain in the affected area.  You have redness that spreads beyond the rash area.  You have fluid, blood, or pus coming from  the affected area.  You have a fever.  You have a rash over a large area of your body.  You have a rash on your eyes, mouth, or genitals.  Your rash does not get better after a few days. Get help right away if:  Your face swells or your eyes swell shut.  You have trouble breathing.  You have trouble swallowing. This information is not intended to replace advice given to you by your health care provider. Make sure you discuss any questions you have with your health care provider. Document Released: 02/18/2010 Document Revised: 06/24/2015 Document Reviewed: 06/24/2014 Elsevier Interactive Patient Education  2018 [employer] Inc[employer]  

## 2017-05-29 MED ORDER — IBUPROFEN 800 MG PO TABS: 800.0000 mg | ORAL_TABLET | Freq: Three times a day (TID) | ORAL | 0 refills | Status: DC | PRN

## 2017-05-30 ENCOUNTER — Ambulatory Visit (INDEPENDENT_AMBULATORY_CARE_PROVIDER_SITE_OTHER): Payer: Self-pay | Admitting: Family Medicine

## 2017-05-30 ENCOUNTER — Encounter: Payer: Self-pay | Admitting: Family Medicine

## 2017-05-30 ENCOUNTER — Other Ambulatory Visit: Payer: Self-pay

## 2017-05-30 VITALS — BP 100/60 | HR 63 | Temp 98.3°F | Ht 64.0 in | Wt 211.6 lb

## 2017-05-30 DIAGNOSIS — K439 Ventral hernia without obstruction or gangrene: Secondary | ICD-10-CM

## 2017-05-30 DIAGNOSIS — D252 Subserosal leiomyoma of uterus: Secondary | ICD-10-CM

## 2017-05-30 DIAGNOSIS — F450 Somatization disorder: Secondary | ICD-10-CM

## 2017-05-31 NOTE — Progress Notes (Signed)
    CHIEF COMPLAINT / HPI: Wants to discuss her abdominal hernia. Says she has anterior abdominal wall pain every day intermittently, 4 out of 10. No unusual weight change although she continues to gain weight when she does not want to.  No problem with appetite.  No diarrhea.  No nausea or vomiting.  REVIEW OF SYSTEMS: See HPI  PERTINENT  PMH / PSH: I have reviewed the patient's medications, allergies, past medical and surgical history, smoking status and updated in the EMR as appropriate.   OBJECTIVE: Vital signs reviewed. GENERAL: Well-developed, well-nourished, no acute distress. CARDIOVASCULAR: Regular rate and rhythm no murmur gallop or rub LUNGS: Clear to auscultation bilaterally, no rales or wheeze. ABDOMEN: Soft positive bowel sounds.  I do not feel a defect in the abdominal wall.  About 3 cm above the umbilicus.  Mass which I think is fibroid. NEURO: No gross focal neurological deficits. MSK: Movement of extremity x 4. Psychiatric: Speech is somewhat pressured.  She perseverates occasionally on some phrases.  She is alert and oriented x4.  Recent and remote memory appear to be intact.    ASSESSMENT / PLAN: Please see problem oriented charting for details

## 2017-05-31 NOTE — Assessment & Plan Note (Signed)
She is perseverating on a diagnosis of abdominal wall hernia.  Discussed at length greater than 50% of our 25 minute ov in counseling education regarding these issues.  She has uterine fibroids which I think I can palpate on her abdominal exam.  I suspect that is was causing her discomfort.  I explained this to her severaltimes.  In past she had uterine artery embolization but never proceeded with follow-up with her OB/GYN.  She was supposed to follow-up for pelvic ultrasound.  She tells me now that she cannot go back to that clinic because she does not have any insurance and she still owes a lot of money from her previous bill.  I told her to get her finances straightened out as she could, and when she did then I would schedule her for a pelvic ultrasound.  We might also need to repeat a CT scan of the abdomen.  I think all these issues are bothering her from a stress standpoint but she does not seem to have any recent symptoms from a clinical standpoint regarding her abdomen, her digestive system or her uterus.

## 2017-07-18 ENCOUNTER — Ambulatory Visit (INDEPENDENT_AMBULATORY_CARE_PROVIDER_SITE_OTHER): Payer: Self-pay | Admitting: Family Medicine

## 2017-07-18 ENCOUNTER — Encounter: Payer: Self-pay | Admitting: Family Medicine

## 2017-07-18 ENCOUNTER — Other Ambulatory Visit: Payer: Self-pay

## 2017-07-18 VITALS — BP 94/56 | HR 65 | Temp 98.3°F | Ht 64.0 in | Wt 204.6 lb

## 2017-07-18 DIAGNOSIS — Z0000 Encounter for general adult medical examination without abnormal findings: Secondary | ICD-10-CM

## 2017-07-18 DIAGNOSIS — I10 Essential (primary) hypertension: Secondary | ICD-10-CM

## 2017-07-18 MED ORDER — LISINOPRIL-HYDROCHLOROTHIAZIDE 10-12.5 MG PO TABS: 1.0000 | ORAL_TABLET | Freq: Every day | ORAL | 3 refills | Status: DC

## 2017-07-18 NOTE — Patient Instructions (Signed)
Let me see you in about a month to recheck your blood pressure on  The new dose

## 2017-07-20 NOTE — Assessment & Plan Note (Signed)
She still has not gotten her insurance straightened out.  When I see her back for to her Pap smear she is gotten that insurance straightened out.  She also needs follow-up pelvic ultrasound and/or CT

## 2017-07-20 NOTE — Assessment & Plan Note (Signed)
I do not know if it is just because she is taking her blood pressure medicines more regularly for there is some other factor at play, but her pressure is a little lower than we needed she is having some occasional lightheadedness.  We will decrease her dose by half and have her follow-up 1 month.

## 2017-07-20 NOTE — Progress Notes (Signed)
    CHIEF COMPLAINT / HPI: Wants a checkup.  Still has concerns about her abdominal wall.  She is bloating.  Thinks it may be related to what she is interested to be an abdominal hernia.  She has no problems with nausea, no vomiting, no diarrhea no constipation.  REVIEW OF SYSTEMS: See HPI.  PERTINENT  PMH / PSH: I have reviewed the patient's medications, allergies, past medical and surgical history, smoking status and updated in the EMR as appropriate.   OBJECTIVE: Vital signs reviewed. GENERAL: Well-developed, well-nourished, no acute distress. CARDIOVASCULAR: Regular rate and rhythm no murmur gallop or rub LUNGS: Clear to auscultation bilaterally, no rales or wheeze. ABDOMEN: Soft positive bowel sounds NEURO: No gross focal neurological deficits. MSK: Movement of extremity x 4.    ASSESSMENT / PLAN: Please see problem oriented charting for details

## 2019-08-27 ENCOUNTER — Encounter: Payer: Self-pay | Admitting: Family Medicine

## 2019-08-29 ENCOUNTER — Other Ambulatory Visit: Payer: Self-pay

## 2019-08-29 ENCOUNTER — Ambulatory Visit (INDEPENDENT_AMBULATORY_CARE_PROVIDER_SITE_OTHER): Payer: Self-pay | Admitting: Family Medicine

## 2019-08-29 DIAGNOSIS — H9391 Unspecified disorder of right ear: Secondary | ICD-10-CM

## 2019-08-29 NOTE — Patient Instructions (Addendum)
Hi Ms Castellanos it was lovely to meet you today. Your ear exam was overall normal today which is reassuring and do not think you have an infection. I am not sure what is causing your symptoms in the ear. If you develop dizziness/the room spinning sensation/hearing loss, balance issues or viral symptoms such as sore throat, cold etc then please follow up with your PCP.  Best wishes,  Dr Posey Pronto

## 2019-08-30 DIAGNOSIS — H9391 Unspecified disorder of right ear: Secondary | ICD-10-CM | POA: Insufficient documentation

## 2019-08-30 NOTE — Progress Notes (Signed)
    SUBJECTIVE:   CHIEF COMPLAINT / HPI:   Carrie Roth is a 54 year old female who presents today with ear complaint  Right ear problem Patient endorses an "unusual" intermittent, popping/pulsating sensation in right ear for the last few days. She first noticed this when she was laying down. Denies having this problem previously and is currently asymptomatic. Was noticeably worse on sitting up and ambulation. Denies symptoms in the left ear. Denies tinnitus, hearing loss, fevers, ear pain, ear discharge, vertigo, dizziness, URT symptoms. No recent swimming. No sick contacts.  PERTINENT  PMH / PSH:  HTN  OBJECTIVE:   BP 122/76   Pulse 58   Ht 5\' 3"  (1.6 m)   Wt (!) 204 lb 3.2 oz (92.6 kg)   SpO2 97%   BMI 36.17 kg/m    General: Alert, pleasant, no acute distress Ear: Limited exam as patient was unable to tolerate for a exam.  However was able able to visualize tympanic membrane bilaterally which were normal.  Normal ear canals bilaterally. Cardio: warm and well perfused Pulm: Speaking in full sentences Neuro: Cranial nerve exam normal Normal gait  ASSESSMENT/PLAN:   Ear problem, right Unclear etiology of right ear symptoms as they are very vague and now resolved. Could be eustachian tube dysfunction. Limited ear exam today as patient was unable to tolerate well, however TM visualized and normal. Considered otitis media however not of typical age group and no ear pain, ear discharge, fevers or URT symptoms. Considered otitis externa however no external ear pain and normal exam. Also considered cerebellar stroke however associated ataxia or vertigo. Reassuring that symptoms have resolved and opted for watch and wait approach. Patient has follow up with PCP in August where she can discuss her symptoms if still persistent.     Lattie Haw, MD [place]

## 2019-08-30 NOTE — Assessment & Plan Note (Addendum)
Unclear etiology of right ear symptoms as they are very vague and now resolved. Could be eustachian tube dysfunction. Limited ear exam today as patient was unable to tolerate well, however TM visualized and normal. Considered otitis media however not of typical age group and no ear pain, ear discharge, fevers or URT symptoms. Considered otitis externa however no external ear pain and normal exam. Also considered cerebellar stroke however associated ataxia or vertigo. Reassuring that symptoms have resolved and opted for watch and wait approach. Patient has follow up with PCP in August where she can discuss her symptoms if still persistent.

## 2019-09-17 ENCOUNTER — Encounter: Payer: Self-pay | Admitting: Family Medicine

## 2019-09-17 ENCOUNTER — Ambulatory Visit (INDEPENDENT_AMBULATORY_CARE_PROVIDER_SITE_OTHER): Payer: 59 | Admitting: Family Medicine

## 2019-09-17 ENCOUNTER — Other Ambulatory Visit: Payer: Self-pay

## 2019-09-17 ENCOUNTER — Telehealth: Payer: Self-pay | Admitting: Family Medicine

## 2019-09-17 VITALS — BP 138/72 | HR 74 | Ht 63.0 in | Wt 204.4 lb

## 2019-09-17 DIAGNOSIS — K439 Ventral hernia without obstruction or gangrene: Secondary | ICD-10-CM

## 2019-09-17 DIAGNOSIS — Z0000 Encounter for general adult medical examination without abnormal findings: Secondary | ICD-10-CM | POA: Diagnosis not present

## 2019-09-17 DIAGNOSIS — I10 Essential (primary) hypertension: Secondary | ICD-10-CM

## 2019-09-17 NOTE — Patient Instructions (Addendum)
Great to see you!  Will

## 2019-09-17 NOTE — Telephone Encounter (Signed)
Pt has provided a copy of her COVID -19 Vaccination Record Card to be added to her file; placed in white team folder

## 2019-09-17 NOTE — Assessment & Plan Note (Signed)
Will refer to surgery.

## 2019-09-17 NOTE — Progress Notes (Signed)
    CHIEF COMPLAINT / HPI:   #1.  Wants to address the abdominal wall/ventral hernia.  Says it is really getting on her nerves and she wants to get it fixed.  Not having any change in bowel or bladder habits. #2.  General checkup: Wants to check her blood work.  PERTINENT  PMH / PSH: I have reviewed the patient's medications, allergies, past medical and surgical history, smoking status and updated in the EMR as appropriate.   OBJECTIVE:  BP 138/72   Pulse 74   Ht 5\' 3"  (1.6 m)   Wt 204 lb 6.4 oz (92.7 kg)   SpO2 97%   BMI 36.21 kg/m  Vital signs reviewed. GENERAL: Well-developed, well-nourished, no acute distress. CARDIOVASCULAR: Regular rate and rhythm no murmur gallop or rub LUNGS: Clear to auscultation bilaterally, no rales or wheeze. ABDOMEN: Soft positive bowel sounds NEURO: No gross focal neurological deficits. MSK: Movement of extremity x 4.    ASSESSMENT / PLAN:   No problem-specific Assessment & Plan notes found for this encounter.   Dorcas Mcmurray MD

## 2019-09-18 ENCOUNTER — Encounter: Payer: Self-pay | Admitting: Family Medicine

## 2019-09-18 LAB — COMPREHENSIVE METABOLIC PANEL
ALT: 19 IU/L (ref 0–32)
AST: 22 IU/L (ref 0–40)
Albumin/Globulin Ratio: 1.6 (ref 1.2–2.2)
Albumin: 3.6 g/dL — ABNORMAL LOW (ref 3.8–4.9)
Alkaline Phosphatase: 56 IU/L (ref 48–121)
BUN/Creatinine Ratio: 17 (ref 9–23)
BUN: 17 mg/dL (ref 6–24)
Bilirubin Total: 0.3 mg/dL (ref 0.0–1.2)
CO2: 24 mmol/L (ref 20–29)
Calcium: 8.9 mg/dL (ref 8.7–10.2)
Chloride: 106 mmol/L (ref 96–106)
Creatinine, Ser: 1.02 mg/dL — ABNORMAL HIGH (ref 0.57–1.00)
GFR calc Af Amer: 71 mL/min/{1.73_m2} (ref >59)
GFR calc non Af Amer: 62 mL/min/{1.73_m2} (ref >59)
Globulin, Total: 2.2 g/dL (ref 1.5–4.5)
Glucose: 68 mg/dL (ref 65–99)
Potassium: 4.7 mmol/L (ref 3.5–5.2)
Sodium: 142 mmol/L (ref 134–144)
Total Protein: 5.8 g/dL — ABNORMAL LOW (ref 6.0–8.5)

## 2019-09-18 NOTE — Progress Notes (Signed)
Letter sent normal

## 2019-09-18 NOTE — Telephone Encounter (Signed)
COVID vaccines documented chart. Ottis Stain, CMA

## 2020-02-25 ENCOUNTER — Other Ambulatory Visit: Payer: Self-pay

## 2020-02-25 ENCOUNTER — Encounter: Payer: Self-pay | Admitting: Family Medicine

## 2020-02-25 ENCOUNTER — Ambulatory Visit (INDEPENDENT_AMBULATORY_CARE_PROVIDER_SITE_OTHER): Payer: 59 | Admitting: Family Medicine

## 2020-02-25 VITALS — BP 132/84 | HR 64 | Ht 63.0 in | Wt 217.8 lb

## 2020-02-25 DIAGNOSIS — Z23 Encounter for immunization: Secondary | ICD-10-CM | POA: Diagnosis not present

## 2020-02-25 DIAGNOSIS — F418 Other specified anxiety disorders: Secondary | ICD-10-CM

## 2020-02-25 DIAGNOSIS — K439 Ventral hernia without obstruction or gangrene: Secondary | ICD-10-CM

## 2020-02-25 DIAGNOSIS — R5383 Other fatigue: Secondary | ICD-10-CM | POA: Diagnosis not present

## 2020-02-25 NOTE — Patient Instructions (Addendum)
Your mammogram is due in July 2022 Your colonoscopy it is due next  In 2028 Your pap was in December 2020 and is due again December 2023  I will check some blood work today and send you a note.  Call your Insurance company and ask who is on their plan for General Surgery. Lyt me know who that is and I can send them the information.   It was great to see you!

## 2020-02-26 DIAGNOSIS — F418 Other specified anxiety disorders: Secondary | ICD-10-CM | POA: Insufficient documentation

## 2020-02-26 LAB — CBC
Hematocrit: 43.1 % (ref 34.0–46.6)
Hemoglobin: 14 g/dL (ref 11.1–15.9)
MCH: 28.8 pg (ref 26.6–33.0)
MCHC: 32.5 g/dL (ref 31.5–35.7)
MCV: 89 fL (ref 79–97)
Platelets: 221 10*3/uL (ref 150–450)
RBC: 4.86 x10E6/uL (ref 3.77–5.28)
RDW: 12.3 % (ref 11.7–15.4)
WBC: 6.1 10*3/uL (ref 3.4–10.8)

## 2020-02-26 LAB — COMPREHENSIVE METABOLIC PANEL
ALT: 11 IU/L (ref 0–32)
AST: 14 IU/L (ref 0–40)
Albumin/Globulin Ratio: 1.5 (ref 1.2–2.2)
Albumin: 3.5 g/dL — ABNORMAL LOW (ref 3.8–4.9)
Alkaline Phosphatase: 59 IU/L (ref 44–121)
BUN/Creatinine Ratio: 16 (ref 9–23)
BUN: 15 mg/dL (ref 6–24)
Bilirubin Total: 0.2 mg/dL (ref 0.0–1.2)
CO2: 25 mmol/L (ref 20–29)
Calcium: 8.7 mg/dL (ref 8.7–10.2)
Chloride: 105 mmol/L (ref 96–106)
Creatinine, Ser: 0.94 mg/dL (ref 0.57–1.00)
GFR calc Af Amer: 78 mL/min/{1.73_m2} (ref >59)
GFR calc non Af Amer: 68 mL/min/{1.73_m2} (ref >59)
Globulin, Total: 2.4 g/dL (ref 1.5–4.5)
Glucose: 96 mg/dL (ref 65–99)
Potassium: 4.8 mmol/L (ref 3.5–5.2)
Sodium: 140 mmol/L (ref 134–144)
Total Protein: 5.9 g/dL — ABNORMAL LOW (ref 6.0–8.5)

## 2020-02-26 LAB — TSH: TSH: 1.36 u[IU]/mL (ref 0.450–4.500)

## 2020-02-26 NOTE — Assessment & Plan Note (Signed)
Reviewed her concerns at length.  Reviewed her health maintenance including normal colonoscopy, mammogram, Pap smear which are all up-to-date and normal.  We will get routine lab work today as well. Myalgias and arthralgias seem to be related to normal wear and tear type of symptoms for given age.  She is quite active but has not been working out regularly.  I recommended she restart that.  I do not think this alleviated her concerns entirely.  I will have her follow-up with me in 4 to 6 weeks.  I will send her a note about her blood work.  30 minutes spent face-to-face in discussion of these issues.

## 2020-02-26 NOTE — Progress Notes (Signed)
CHIEF COMPLAINT / HPI: Wants a checkup because she is concerned about her health.  She has had some increased fatigue and has had a lot of increasing myalgias and arthralgias particularly pain in her right upper leg that is intermittent, sharp, can be disabling.  This comes with no specific activity.  Does not occur during night.  She is worried that the symptoms may be some sign of other underlying severe disease such as cancer or multiple sclerosis.   Review of Systems  Constitutional: Negative for activity chang; no  appetite change and no unexpected weight change.  Eyes: Negative for eye pain and no visual disturbance.  Neck: denies neck pain; no swallowing problems CV: Positive for intermittent sharp pains in her right chest area that makes her significantly concerned for breast cancer.  These do not radiate.  No specific inciting activity.  They are brief lasting less than 10 seconds most of the time.  No shortness of breath, no lower extremity edema. No change in exercise tolerance Respiratory: Negative for cough or wheezing.  No shortness of breath. Gastrointestinal: Negative for abdominal pain, no diarrhea and no  constipation.  Genitourinary: Negative for decreased urine volume and  no difficulty urinating.  Musculoskeletal: Positive for arthralgias and myalgias as per HPI.Marland Kitchen No muscle weakness. Skin: Negative for rash.  Psychiatric/Behavioral: Negative for behavioral problems; no sleep disturbance and no  agitation. She does find herself quite concerned about the status of her health.    PERTINENT  PMH / PSH: I have reviewed the patient's medications, allergies, past medical and surgical history, smoking status and updated in the EMR as appropriate.  mammogram is due in July 2022, results normal  colonoscopy it is due next  In 2028, results normal Last  pap was in December 2020 and is due again December 2023, results normal  OBJECTIVE:  BP 132/84   Pulse 64   Ht 5\' 3"  (1.6  m)   Wt 217 lb 12.8 oz (98.8 kg)   SpO2 97%   BMI 38.58 kg/m  Vital signs reviewed GENERALl: Well developed, well nourished, in no acute distress. HEENT: PERRLA, EOMI, sclerae are nonicteric NECK: Supple, FROM, without lymphadenopathy.  THYROID: normal without nodularity CAROTID ARTERIES: without bruits LUNGS: clear to auscultation bilaterally. No wheezes or rales. Normal respiratory effort HEART: Regular rate and rhythm, no murmurs. Distal pulses are bilaterally symmetrical, 2+. ABDOMEN: soft with positive bowel sounds. Subtle umbilical hernia, no concern for incarceration. MSK: MOE x 4. Normal muscle strength, bulk and tone. No hip or knee or shoulder pathology noted with painless FROM all major joints.  SKIN no rash. Normal temperature. NEURO: no focal deficits. Normal gait. Normal balance. PSYCH: AxOx4. Good eye contact.. No psychomotor retardation or agitation. Appropriate speech fluency and content. Asks and answers questions appropriately. Mood is mildly anxious when discussing health concerns.   ASSESSMENT / PLAN:   Anxiety about health Reviewed her concerns at length.  Reviewed her health maintenance including normal colonoscopy, mammogram, Pap smear which are all up-to-date and normal.  We will get routine lab work today as well. Myalgias and arthralgias seem to be related to normal wear and tear type of symptoms for given age.  She is quite active but has not been working out regularly.  I recommended she restart that.  I do not think this alleviated her concerns entirely.  I will have her follow-up with me in 4 to 6 weeks.  I will send her a note about her blood work.  30 minutes spent face-to-face in discussion of these issues.  Hernia of abdominal wall We discussed her abdominal hernia which was evaluated by a surgeon at [facility].  They had planned surgery but her insurance did not cover that so the procedure was canceled.  I reviewed his notes.  I am not sure  who is under her current insurance plan but I asked her to call her insurance company I would be happy to refer her to any general surgeon on their insurance coverage.   Dorcas Mcmurray MD

## 2020-02-26 NOTE — Assessment & Plan Note (Signed)
We discussed her abdominal hernia which was evaluated by a surgeon at [facility].  They had planned surgery but her insurance did not cover that so the procedure was canceled.  I reviewed his notes.  I am not sure who is under her current insurance plan but I asked her to call her insurance company I would be happy to refer her to any general surgeon on their insurance coverage.

## 2020-03-03 ENCOUNTER — Encounter: Payer: Self-pay | Admitting: Family Medicine

## 2020-03-03 NOTE — Progress Notes (Signed)
Letter sent All of your labs are NORMAL! Very reassuring.

## 2020-04-15 ENCOUNTER — Ambulatory Visit (INDEPENDENT_AMBULATORY_CARE_PROVIDER_SITE_OTHER): Payer: 59 | Admitting: Family Medicine

## 2020-04-15 ENCOUNTER — Encounter: Payer: Self-pay | Admitting: Family Medicine

## 2020-04-15 ENCOUNTER — Telehealth: Payer: Self-pay | Admitting: Family Medicine

## 2020-04-15 ENCOUNTER — Ambulatory Visit (HOSPITAL_COMMUNITY)
Admission: RE | Admit: 2020-04-15 | Discharge: 2020-04-15 | Disposition: A | Discharge status: Home / Self Care | Payer: 59 | Source: Ambulatory Visit | Attending: Family Medicine | Admitting: Family Medicine

## 2020-04-15 ENCOUNTER — Other Ambulatory Visit: Payer: Self-pay

## 2020-04-15 VITALS — BP 140/76 | HR 62 | Ht 63.0 in | Wt 217.4 lb

## 2020-04-15 DIAGNOSIS — M7989 Other specified soft tissue disorders: Secondary | ICD-10-CM

## 2020-04-15 DIAGNOSIS — M79604 Pain in right leg: Secondary | ICD-10-CM | POA: Diagnosis not present

## 2020-04-15 DIAGNOSIS — M25561 Pain in right knee: Secondary | ICD-10-CM | POA: Diagnosis not present

## 2020-04-15 MED ORDER — HYDROCODONE-ACETAMINOPHEN 5-325 MG PO TABS: 1.0000 | ORAL_TABLET | Freq: Four times a day (QID) | ORAL | 0 refills | Status: DC | PRN

## 2020-04-15 MED ORDER — IBUPROFEN 800 MG PO TABS: 800.0000 mg | ORAL_TABLET | Freq: Three times a day (TID) | ORAL | 0 refills | Status: DC | PRN

## 2020-04-15 NOTE — Patient Instructions (Addendum)
I want to make certain that this pain in right leg is not due to a blood clot in your right leg.  This will be done by an Ultrasound exam of the right leg looking for blood clots.   The other possible causes are inflammation of the muscles causing muscle damage and older-age arthritis of the joints.  We are checking blood work to see if this is from crippling arthritis.  Please get the Xrays of your right hip and right knee so we can see if the pain is coming from your bones or joints. .    Take one tablet of the hydrocodone with acetaminophen every 6 hours, if you need it for the pain.    You may also take Ibuprofen 800 mg tablet, one tablet with food every 8 hours if you need it.  We will not be able to refill this medication without being seen by a physician again.    Dr Aberdeen Hafen will call you when we have the all the test results back.

## 2020-04-15 NOTE — Progress Notes (Signed)
  Right Leg Pain Onset: It was intermittent since late January, but has become more constand in last two weeks Location: While pain is in whole right leg, the worst of the pain is in popliteal/proximal posterior lower leg Quality: throbbing Severity: 10/10 Function: She has been unable to work the last two weeks.  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  She is fearful that she will lose clients because she has been unable to work  Pattern: initially intermittent, now more constant Course: worsening Radiation: into posterior thigh and dorsum of foot, particularly when foot is dosiflexed, as with pressing car accelerator. Worsening with right knee extension and right ankle dorsiflexion Relief: using hands to lift extended leg into ~ 90 degree flexed position. Minimal releif with Ibuprofen 800 mg and a couple hydrocodone she had left over from dental surgery Precipitant: none.  Very distant history of fall onto leg 2010 - Normal knee Xray Associated Symptoms:       Restricted ROM/stiffness/swelling:  Right thigh and lower leg swollen - new in last several days.  She has a history of bilateral leg swelling.        Muscle strength change: unchanged except pain,        Change in sensation (dysesthesia/itch or numbness): no       Change in sleep from pain: yes        No fever       No back pain  Trauma (Acute or Chronic): no Prior Diagnostic Testing or Treatments: none Relevant PMH/PSH: none   Exam - right leg Gen: demonstrative of pain and discomfort, pleasant, groomed Right lower leg visibly edematous 1+ to knee and possibly above.  Right calf circumference 45 cm compared 42.5 cm on left.  No increased warmth of soft tissues or over joints. No joint swelling.  PROM ankle and knee and hip without pain.  Able to active and passive DF/PF ankle.  No palpable masses or cords in popliteal  No tenderness of hamstring tendons near insertion sites. (-) Homans Knee extend 90 degrees actively,  4+/5  strength (limit secondary to pain) flexion, 5/5 strength extension. 4/5 hip strength extension (limited by pain) & 5/5 strength hip flexion Intact lateral and collateral lig.  Normal Ant drawer sign.   Hip Internal and external rotation painless and full rom Hip abduction and adduction 5/5 strength  Neuro 1+ knee DTR bilaterally 0 ankle DTR bilaterally  Gait: atalgic Recent CMET & CBC & TSH unremarkable   A/P Persistent right leg pain, predominantly posterior popliteal  acute to subacute - worsening, associated with recent unilateral edema.  - Ddx: R/O DVT, muscle tear/strain, tendopathy, OA, Bursitis, myopathy  Plan STAT Venous Doppler right leg;  DG XR knee and Hip,  serum CPK,  ESR Rx Ibu 800 mg prn Rx Norco 5/325 #20  Time for visit  CPT E&M Office Visit Time 35 minutes total  minutes   Level 2 Est: 10-19 min     New: 15-29 min Level 3 Est: 20-29 min     New: 30-44 min Level 4 Est: 30-39 min     New: 45-59 min Level 5 Est: 40-54 min     New: 60-74 min   > Level 5 - see prolonged service CPT E&M codes; 99XXX

## 2020-04-16 ENCOUNTER — Telehealth: Payer: Self-pay | Admitting: Family Medicine

## 2020-04-16 ENCOUNTER — Ambulatory Visit
Admission: RE | Admit: 2020-04-16 | Discharge: 2020-04-16 | Disposition: A | Discharge status: Home / Self Care | Payer: 59 | Source: Ambulatory Visit | Attending: Family Medicine | Admitting: Family Medicine

## 2020-04-16 DIAGNOSIS — M79604 Pain in right leg: Secondary | ICD-10-CM

## 2020-04-16 DIAGNOSIS — M79671 Pain in right foot: Secondary | ICD-10-CM

## 2020-04-16 DIAGNOSIS — K047 Periapical abscess without sinus: Secondary | ICD-10-CM

## 2020-04-16 DIAGNOSIS — M25561 Pain in right knee: Secondary | ICD-10-CM

## 2020-04-16 DIAGNOSIS — M7989 Other specified soft tissue disorders: Secondary | ICD-10-CM

## 2020-04-16 LAB — SEDIMENTATION RATE: Sed Rate: 13 mm/hr (ref 0–40)

## 2020-04-16 LAB — CK: Total CK: 414 U/L — ABNORMAL HIGH (ref 32–182)

## 2020-04-16 MED ORDER — PENICILLIN V POTASSIUM 500 MG PO TABS: 500.0000 mg | ORAL_TABLET | Freq: Four times a day (QID) | ORAL | 0 refills | Status: DC

## 2020-04-16 NOTE — Telephone Encounter (Signed)
I left message on patient vm about her negative veinous doppler and that we will await result of her lab and XR tests.

## 2020-04-16 NOTE — Telephone Encounter (Signed)
I spoke with Ms Kendrick about her venous doppler and lab test results, including the slightly elevated total CK. She will be going for her XR of right leg today.  The mildly elevated CK is the only lead besides the pain localizing in peri-poplitial region of her right leg.    We also discussed the right lower tooth pain that she had mentioned during yesterdays visit which I forgot to examine.    She reports a painful bulge in the gum of a tooth that is similar to when she had a tooth abscess in the past that required the tooth to be pulled.   Rx PCN VK x 10d

## 2020-04-16 NOTE — Telephone Encounter (Signed)
Will contact patient next week for follow up.  Patient has follow up with Dr Nori Riis on 04/28/20.

## 2020-04-17 NOTE — Telephone Encounter (Signed)
I spoke with Carrie Roth about my impression of her Xrays looking relatively normal, but the formal reading has not yet been recorded.

## 2020-04-19 ENCOUNTER — Telehealth: Payer: Self-pay | Admitting: Family Medicine

## 2020-04-20 ENCOUNTER — Telehealth: Payer: Self-pay | Admitting: Family Medicine

## 2020-04-20 DIAGNOSIS — R748 Abnormal levels of other serum enzymes: Secondary | ICD-10-CM

## 2020-04-20 DIAGNOSIS — M79604 Pain in right leg: Secondary | ICD-10-CM

## 2020-04-20 NOTE — Telephone Encounter (Signed)
I spoke with Ms Carrie Roth about her right knee and hip XR results. I was not certain that the mild knee OA would explain her degree of pain nor explain the elevation in total CK.   Dr Dorcas Mcmurray said she would see Ms Carrie Roth this Friday at [facility] for further evaluation of her right leg pain.   Referral order for SM made.

## 2020-04-20 NOTE — Telephone Encounter (Signed)
ERROR

## 2020-04-22 ENCOUNTER — Ambulatory Visit: Payer: 59 | Admitting: Family Medicine

## 2020-04-28 ENCOUNTER — Ambulatory Visit: Payer: 59 | Admitting: Family Medicine

## 2020-04-29 ENCOUNTER — Other Ambulatory Visit: Payer: Self-pay

## 2020-04-29 ENCOUNTER — Ambulatory Visit (INDEPENDENT_AMBULATORY_CARE_PROVIDER_SITE_OTHER): Payer: 59 | Admitting: Family Medicine

## 2020-04-29 VITALS — BP 128/92 | Ht 63.0 in | Wt 215.0 lb

## 2020-04-29 DIAGNOSIS — M79604 Pain in right leg: Secondary | ICD-10-CM

## 2020-04-29 MED ORDER — GABAPENTIN 100 MG PO CAPS: 100.0000 mg | ORAL_CAPSULE | Freq: Every day | ORAL | 0 refills | Status: DC

## 2020-04-29 NOTE — Progress Notes (Addendum)
Office Visit Note   Patient: Carrie Roth           Date of Birth: 1965-02-25           MRN: [id number] Visit Date: 04/29/2020 Requested by: McDiarmid, Blane Ohara, MD [address] [place],  [place] [address] PCP: [name], MD  Subjective: CC: Right leg pain  HPI: 55 year old female presenting to clinic today with concerns of right leg pain since January.  Patient denies any trauma at the onset of her symptoms, or changes in medications or activity.  She states that she started to experience an extreme pain throughout the entire posterior aspect of her right leg starting approximately 3 months ago.  She says her leg was diffusely tender to palpation, and she felt that she had to lift her leg with her hands she was unable to do so with the muscles in her leg themselves.  She says "on a scale of 1-10, my pain was easily a 15."  She describes needing to drag her leg behind her when she was walking.  And an intolerance of sitting in a car for any length of time.  She saw a provider in her primary care clinic, who ordered an ultrasound to rule out blood clots, which was negative.  Of the labs performed at that time, CK did return mildly elevated at 415.  Patient says that she was started on ibuprofen, as well as Norco, and her pain today has improved significantly.  Currently, she states she is only had a 2 out of 10 pain.  She is very grateful that she is recovered so well in the past week, but is frightened that her pain will return.  She denies any systemic symptoms to accompany this pain, but does state that she felt her right leg was a little swollen compared to the left when her pain was severe.  No numbness or weakness in the leg today.  She states her pain was "sharp" in nature.  Pain was located throughout her foot, knee, hip, and somewhat in the lower back as well.  She has never experienced any symptoms like this before.              ROS:   All other systems were reviewed and are  negative.  Objective: Vital Signs: BP (!) 128/92   Ht 5\' 3"  (1.6 m)   Wt 215 lb (97.5 kg)   BMI 38.09 kg/m  No flowsheet data found.   No flowsheet data found.  Physical Exam:  General:  Alert and oriented, in no acute distress. Pulm:  Breathing unlabored. Psy:  Normal mood, congruent affect. Skin: Lower back and right leg without bruises, rashes, or erythema. Overlying skin intact.   BACK EXAMINATION: Normal Gait.  Normal Spinal curvature, without excessive lumbar lordosis, thoracic kyphosis, or scoliosis.   ROM: No Pain with forward flexion or extension. Able to achieve Toe-Touch.  Full range of motion in bilateral hips, with symmetrical internal and external rotation.  Full range of motion with flexion and extension of right knee, as well as within bilateral ankles.   Palpation: Very minor tenderness to palpation of right medial joint line of the knee.  Does have some tenderness within the sciatic notch of the left gluteal region. Tenderness within the lower lumbar paraspinal muscles bilaterally, as well as within right SI joint.   No tenderness throughout musculature of thigh or calf.   No tenderness along ankle.   No tenderness over the  greater trochanteric area.  No midline spinal  tenderness, no deformity or step-offs.    Strength: Hip flexion (L1), Hip Aduction (L2), Knee Extension (L3) are 5/5 Bilaterally Foot Inversion (L4), Dorsiflexion (L5), and Eversion (S1) 5/5 Bilaterally Sensation: Intact to light touch medial and lateral aspects of lower extremities, and lateral, dorsal, and medial aspects of foot.   Reflexes: Patellar (L4), and Ankle (S1) 1+ Bilaterally  Special Tests:  FABER and FADIR dear no SI Joint Pain, or deep groin pain. SLR: No radiation down Ipilateral or contralateral leg bilaterally Limb Length: Hips Aligned, No obvious discrepancy at medial malleolus    Imaging: Previously obtained knee x-rays with mild degenerative changes.  Negative duplex  ultrasound.   Assessment & Plan: 55 year old female presenting to clinic with improving severe right leg pain.  Reassuringly this pain has reduced markedly over the past 2 weeks, and patient seems to be on the mend.  Suspect likely neuropathic source of her pain, either lumbar radiculopathy or possible lumbar plexus inflammation. -We will give low-dose gabapentin to be taken at night to further improve her symptoms.  She may discontinue this once her symptoms have resolved. -Optimistic that patient continues to improve and the symptoms eventually fade.  Return to clinic in 2 to 3 weeks if this is not the case. -Patient was agreeable with plan.  She had no further questions or concerns today.     Procedures: No procedures performed      Addendum:  I was the preceptor for this visit and available for immediate consultation.  Karlton Lemon MD Kirt Boys

## 2020-09-21 ENCOUNTER — Encounter: Payer: Self-pay | Admitting: Family Medicine

## 2020-11-10 ENCOUNTER — Telehealth: Payer: Self-pay

## 2020-11-10 NOTE — Telephone Encounter (Signed)
Patient calls nurse line requesting medication for allergies. Patient reports sneezing, runny nose and scratchy throat. Reports this has "been going on for a while now." Denies cough, fever or body aches. Patient reports that she has tried OTC benadryl that is the only thing that works. Patient states that she has tried other OTC cetirizine that has not been working.   Patient does not want to schedule appointment at this time.   Please advise.   Talbot Grumbling, RN

## 2020-11-10 NOTE — Telephone Encounter (Signed)
Dear Dema Severin Team Please let her know there is nothing available by prescription that will likely do any better than OTC claritin , or OTC  zyrtec, although OTC Zyxal seems to work better for some people. THANKS! Dorcas Mcmurray

## 2021-04-06 ENCOUNTER — Encounter: Payer: Self-pay | Admitting: Family Medicine

## 2021-04-06 ENCOUNTER — Ambulatory Visit (INDEPENDENT_AMBULATORY_CARE_PROVIDER_SITE_OTHER): Payer: 59 | Admitting: Family Medicine

## 2021-04-06 ENCOUNTER — Other Ambulatory Visit: Payer: Self-pay

## 2021-04-06 VITALS — BP 125/72 | HR 61 | Ht 63.0 in | Wt 215.0 lb

## 2021-04-06 DIAGNOSIS — G4486 Cervicogenic headache: Secondary | ICD-10-CM

## 2021-04-06 DIAGNOSIS — Z23 Encounter for immunization: Secondary | ICD-10-CM | POA: Diagnosis not present

## 2021-04-06 DIAGNOSIS — I10 Essential (primary) hypertension: Secondary | ICD-10-CM | POA: Diagnosis not present

## 2021-04-06 DIAGNOSIS — Z0000 Encounter for general adult medical examination without abnormal findings: Secondary | ICD-10-CM | POA: Diagnosis not present

## 2021-04-06 DIAGNOSIS — J301 Allergic rhinitis due to pollen: Secondary | ICD-10-CM

## 2021-04-06 MED ORDER — CYCLOBENZAPRINE HCL 5 MG PO TABS: ORAL_TABLET | ORAL | 1 refills | Status: DC

## 2021-04-06 NOTE — Patient Instructions (Signed)
YOur next mammogram is due in August 2023 ?Your next pap is in 2025 and your colonoscopy 2028. ? ? ?For your allergies lets try adding sudaphed in the AM. You can buy it over the counter. Buy the sudaphed that you do not need to show your drivers license for.  ?You can still take the benadryl at night and I moght add that as a routine for the next few weeks of allergy season. ? ?Your headaches may be related to neck issues.I am calling in some cyclobenzaprine. If you have the headache and it is not going away, take one tab before bedtime. Do not take the benadryl at the same night as you will be very sleepy in AM! ? ?Also, get your eye glasses. ? ?If your headaches are getting worse, please come back and see me ? ?Gtreat to see you! ? ?

## 2021-04-06 NOTE — Assessment & Plan Note (Signed)
Can continue Benadryl at night unless she is also taking the cyclobenzaprine.  She is cautioned against that.  For the daytime symptoms, I will have her try over-the-counter Sudafed.  If that is not working, she will let me know. ?

## 2021-04-06 NOTE — Progress Notes (Signed)
? ? ?  CHIEF COMPLAINT / HPI: ?  ?Here for physical exam.  Has a couple of issues. ?1.  Headaches: Rarely has headaches and usually they are associated with not eating.  Over the last couple of months she has noticed a new type of headache that is recurrent.  It is bandlike including the occiput and the forehead.  4-6 out of 10 at its worst.  No associated symptoms such as photophobia or dizziness or aura.  Dull and throbbing.  Does not typically go away and in fact she will have it for 2 or 3 days before it resolves.  Ibuprofen does not seem to help but even if she takes in her milligrams. ?-   Notes that she does not sleep in her bed, she sleeps on a recliner and has for quite some time.  Also has not gotten her new prescription for her glasses even though she has had her eyes checked. ? ?#2.  Allergy symptoms: None of the over-the-counter allergy medicine seems to be working for her.  Benadryl works but she cannot take it during the day.  Most symptoms are nasal stuffiness, nasal discharge.  No cough. ? ?Otherwise she has been feeling well.  She has some questions about health maintenance. ? ?PERTINENT  PMH / PSH: I have reviewed the patient?s medications, allergies, past medical and surgical history, smoking status and updated in the EMR as appropriate. ? ? ?OBJECTIVE: ? BP 125/72   Pulse 61   Ht '5\' 3"'$  (1.6 m)   Wt 215 lb (97.5 kg)   BMI 38.09 kg/m?  ?Vital signs reviewed. ?GENERAL: Well-developed, well-nourished, no acute distress. ?CARDIOVASCULAR: Regular rate and rhythm no murmur gallop or rub ?LUNGS: Clear to auscultation bilaterally, no rales or wheeze. ?ABDOMEN: Soft positive bowel sounds ?NEURO: No gross focal neurological deficits. ?MSK: Movement of extremity x 4. ?HEENT: TMs bilaterally are somewhat retracted.  Cone of light is still present and there slightly mobile.  Pupils equal round reactive to light.  Extraocular muscles are intact without nystagmus. ? ?ASSESSMENT / PLAN: ? ? ?Well adult  exam ?Reviewed health maintenance.  Next colonoscopy 2028, next Pap smear 2025, next mammogram August 2023.  Lab work today. ? ?Cervicogenic headache ?I suspect these headaches are multifactorial.  Definitely would get her new eyeglasses.  May be some trigger from allergy symptoms and she is going to start Sudafed for that.  Neck exam also revealed a lot of tension in the trapezius.  Wonder if she has some underlying neck arthritis.  We will try as needed nightly Flexeril and see if that helps.  If not she will return to clinic. ? ?Seasonal allergic rhinitis due to pollen ?Can continue Benadryl at night unless she is also taking the cyclobenzaprine.  She is cautioned against that.  For the daytime symptoms, I will have her try over-the-counter Sudafed.  If that is not working, she will let me know. ?  ?Dorcas Mcmurray MD ?

## 2021-04-06 NOTE — Assessment & Plan Note (Signed)
I suspect these headaches are multifactorial.  Definitely would get her new eyeglasses.  May be some trigger from allergy symptoms and she is going to start Sudafed for that.  Neck exam also revealed a lot of tension in the trapezius.  Wonder if she has some underlying neck arthritis.  We will try as needed nightly Flexeril and see if that helps.  If not she will return to clinic. ?

## 2021-04-06 NOTE — Assessment & Plan Note (Signed)
Reviewed health maintenance.  Next colonoscopy 2028, next Pap smear 2025, next mammogram August 2023.  Lab work today. ?

## 2021-04-07 LAB — COMPREHENSIVE METABOLIC PANEL
ALT: 19 IU/L (ref 0–32)
AST: 19 IU/L (ref 0–40)
Albumin/Globulin Ratio: 1.8 (ref 1.2–2.2)
Albumin: 3.6 g/dL — ABNORMAL LOW (ref 3.8–4.9)
Alkaline Phosphatase: 64 IU/L (ref 44–121)
BUN/Creatinine Ratio: 16 (ref 9–23)
BUN: 15 mg/dL (ref 6–24)
Bilirubin Total: 0.2 mg/dL (ref 0.0–1.2)
CO2: 24 mmol/L (ref 20–29)
Calcium: 8.7 mg/dL (ref 8.7–10.2)
Chloride: 104 mmol/L (ref 96–106)
Creatinine, Ser: 0.93 mg/dL (ref 0.57–1.00)
Globulin, Total: 2 g/dL (ref 1.5–4.5)
Glucose: 57 mg/dL — ABNORMAL LOW (ref 70–99)
Potassium: 4.6 mmol/L (ref 3.5–5.2)
Sodium: 140 mmol/L (ref 134–144)
Total Protein: 5.6 g/dL — ABNORMAL LOW (ref 6.0–8.5)
eGFR: 72 mL/min/{1.73_m2} (ref >59)

## 2021-10-14 ENCOUNTER — Ambulatory Visit (INDEPENDENT_AMBULATORY_CARE_PROVIDER_SITE_OTHER): Payer: Commercial Managed Care - HMO | Admitting: Family Medicine

## 2021-10-14 VITALS — BP 155/99 | HR 56 | Temp 98.4°F | Ht 63.0 in | Wt 205.8 lb

## 2021-10-14 DIAGNOSIS — J029 Acute pharyngitis, unspecified: Secondary | ICD-10-CM | POA: Diagnosis not present

## 2021-10-14 DIAGNOSIS — J069 Acute upper respiratory infection, unspecified: Secondary | ICD-10-CM | POA: Insufficient documentation

## 2021-10-14 LAB — POCT RAPID STREP A (OFFICE): Rapid Strep A Screen: NEGATIVE

## 2021-10-14 NOTE — Assessment & Plan Note (Signed)
Strep test per patient request is negative.  Physical exam and age discount possibility of false negative.  No further work-up required.  Suspect sore throat due to postnasal drip.

## 2021-10-14 NOTE — Patient Instructions (Signed)
It was wonderful to meet you today. Thank you for allowing me to be a part of your care. Below is a short summary of what we discussed at your visit today:  Cold and flu symptoms Today your strep throat test came back negative.  This is good news!  We obtained a COVID swab for you, this will come back in 2 to 3 days. If the results are normal, I will send you a letter or MyChart message. If the results are abnormal, I will give you a call.    Continue symptomatic management, including good hydration, well-rounded nutrition, nasal sprays, Tylenol and ibuprofen for muscle aches.  Come back to clinic if you develop fever, chills, or shortness of breath  Blood pressure Your blood pressure was elevated twice today.  It is likely because you feel poorly.  However, I do want you to come back and see your PCP about this once you are back from your trip.  Please stop at the front desk and schedule on your way out.   Please bring all of your medications to every appointment!  If you have any questions or concerns, please do not hesitate to contact us via phone or MyChart message.   Ezequiel Essex, MD

## 2021-10-14 NOTE — Assessment & Plan Note (Addendum)
No red flags on exam today, patient appears well-hydrated.  Suspect viral URI.  Patient requests COVID testing, collected today.  Discussed that results are back in 2 to 3 days.  We will follow results.  In the meantime, recommend conservative management.  See AVS for more.  Return precautions given.

## 2021-10-14 NOTE — Progress Notes (Signed)
    SUBJECTIVE:   CHIEF COMPLAINT / HPI:   [name] is a pleasant 56 year old woman who presents today with malaise, throat itching, and muscle aches.  For the last couple of months, she reports severe worsening allergies and initially thought her symptoms above her allergies.  In the past week, she has been experiencing worsening muscle and joint aches, rhinorrhea, sneezing, sore and itchy throat, and eye pain.    She is requesting a strep and COVID test today, given that she will travel soon.  No fever, chills, cough, sinus pain or fullness, ear pain or fullness, shortness of breath, chest pain or squeezing or tightness, nausea, vomiting, diarrhea, or painful lymphadenopathy.  Her sore throat does not affect her ability to eat and drink.  No flu shot yet this season. Did have initial COVID shot x2 and booster.  She has tried Coricidin over-the-counter for cold and flu symptoms with minimal relief.  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   BREAST MASS, BENIGN 12/24/2006    OBJECTIVE:   BP (!) 155/99   Pulse (!) 56   Temp 98.4 F (36.9 C)   Ht '5\' 3"'$  (1.6 m)   Wt 205 lb 12.8 oz (93.4 kg)   SpO2 100%   BMI 36.46 kg/m   BP initially 160/97, then 155/99 on retake   PHQ-9:     10/14/2021    9:26 AM 04/06/2021    9:05 AM 04/15/2020    9:50 AM  Depression screen PHQ 2/9  Decreased Interest 0 0 1  Down, Depressed, Hopeless 0 1 0  PHQ - 2 Score 0 1 1  Altered sleeping 0 1 0  Tired, decreased energy 1 0 0  Change in appetite '1 1 1  '$ Feeling bad  or failure about yourself  0 0 0  Trouble concentrating 0 1 1  Moving slowly or fidgety/restless 0 0 0  Suicidal thoughts 0 0 0  PHQ-9 Score '2 4 3  '$ Difficult doing work/chores Not difficult at all Not difficult at all    Physical Exam General: Awake, alert, oriented, tired appearing HEENT: PERRL, bilateral TM pearly pink and flat, bilateral external auditory canals with minimal cerumen burden, no lesions, nares with clear discharge, oral mucosa pink, moist, without lesion Lymph: No palpable lymphedema of head or neck Cardiovascular: Regular rate and rhythm, S1 and S2 present, no murmurs auscultated Respiratory: Lung fields clear to auscultation bilaterally  ASSESSMENT/PLAN:   Sore throat Strep test per patient request is negative.  Physical exam and age discount possibility of false negative.  No further work-up required.  Suspect sore throat due to postnasal drip.  Viral URI No red flags on exam today, patient appears well-hydrated.  Suspect viral URI.  Patient requests COVID testing, collected today.  Discussed that results are back in 2 to 3 days.  We will follow results.  In the meantime, recommend conservative management.  See AVS for more.  Return precautions given.    Ezequiel Essex, MD [place]

## 2021-10-17 LAB — COVID-19, FLU A+B NAA
Influenza A, NAA: NOT DETECTED
Influenza B, NAA: NOT DETECTED
SARS-CoV-2, NAA: NOT DETECTED

## 2021-10-18 ENCOUNTER — Encounter: Payer: Self-pay | Admitting: Family Medicine

## 2021-11-02 ENCOUNTER — Encounter: Payer: Self-pay | Admitting: Family Medicine

## 2022-07-01 LAB — AMB RESULTS CONSOLE CBG: Glucose: 128

## 2022-07-03 NOTE — Progress Notes (Unsigned)
Patient has PCP Dr Denny Levy. Patient does not have any SDOH needs at this time.

## 2022-07-05 ENCOUNTER — Encounter: Payer: Commercial Managed Care - HMO | Admitting: Family Medicine

## 2022-07-26 ENCOUNTER — Encounter: Payer: Self-pay | Admitting: Family Medicine

## 2022-07-26 ENCOUNTER — Ambulatory Visit: Payer: 59 | Admitting: Family Medicine

## 2022-07-26 VITALS — BP 126/78 | HR 53 | Ht 63.0 in | Wt 204.2 lb

## 2022-07-26 DIAGNOSIS — J301 Allergic rhinitis due to pollen: Secondary | ICD-10-CM

## 2022-07-26 DIAGNOSIS — I10 Essential (primary) hypertension: Secondary | ICD-10-CM | POA: Diagnosis not present

## 2022-07-26 DIAGNOSIS — Z0000 Encounter for general adult medical examination without abnormal findings: Secondary | ICD-10-CM | POA: Diagnosis not present

## 2022-07-26 NOTE — Patient Instructions (Addendum)
Your pap will be due in the next year. Please make an appointment for your mammogram!  For the itchy part on your back you can use OTC cortisone cream or miconazole.  Great to see you! I will send you a note about your blood work. Please sign up for MyChart.  Great to see you!

## 2022-07-27 ENCOUNTER — Encounter: Payer: Self-pay | Admitting: Family Medicine

## 2022-07-27 LAB — LIPID PANEL
Chol/HDL Ratio: 3.4 ratio (ref 0.0–4.4)
Cholesterol, Total: 177 mg/dL (ref 100–199)
HDL: 52 mg/dL (ref >39)
LDL Chol Calc (NIH): 111 mg/dL — ABNORMAL HIGH (ref 0–99)
Triglycerides: 75 mg/dL (ref 0–149)
VLDL Cholesterol Cal: 14 mg/dL (ref 5–40)

## 2022-07-27 LAB — COMPREHENSIVE METABOLIC PANEL
ALT: 18 IU/L (ref 0–32)
AST: 21 IU/L (ref 0–40)
Albumin: 3.3 g/dL — ABNORMAL LOW (ref 3.8–4.9)
Alkaline Phosphatase: 55 IU/L (ref 44–121)
BUN/Creatinine Ratio: 15 (ref 9–23)
BUN: 14 mg/dL (ref 6–24)
Bilirubin Total: 0.3 mg/dL (ref 0.0–1.2)
CO2: 19 mmol/L — ABNORMAL LOW (ref 20–29)
Calcium: 8.4 mg/dL — ABNORMAL LOW (ref 8.7–10.2)
Chloride: 109 mmol/L — ABNORMAL HIGH (ref 96–106)
Creatinine, Ser: 0.91 mg/dL (ref 0.57–1.00)
Globulin, Total: 2 g/dL (ref 1.5–4.5)
Glucose: 94 mg/dL (ref 70–99)
Potassium: 4.3 mmol/L (ref 3.5–5.2)
Sodium: 142 mmol/L (ref 134–144)
Total Protein: 5.3 g/dL — ABNORMAL LOW (ref 6.0–8.5)
eGFR: 73 mL/min/{1.73_m2} (ref >59)

## 2022-07-27 NOTE — Assessment & Plan Note (Addendum)
Due her next Pap smear in a year. No immunizations today.  Will check some lab work. Discussed health maintenance issues.  She is due her mammogram.

## 2022-07-27 NOTE — Assessment & Plan Note (Signed)
Not currently taking any antihypertensives and blood pressure looks good.  Will continue to monitor.

## 2022-07-27 NOTE — Progress Notes (Signed)
    CHIEF COMPLAINT / HPI: Here for well adult checkup.  Does have a couple of questions she wants to discuss 2.  Has had some irritation in her gluteal cleft.  Not sure if this is normal or not she is concerned about it.  No rectal bleeding.  She has used cortisone cream and also some Preparation H with help.  Its intermittent.  Irritation is actually more in the gluteal cleft than rectal. 2.  A lot of allergy symptoms that seem not well-managed by Zyrtec or Claritin.  She does use Benadryl but it makes her very sleepy so she can only use it at night.  Mostly runny nose and nasal congestion. #3.  Says she wants to potentially get her abdominal hernia looked at again sometime in the next year but does not really want to pursue that today. 4.  Healthcare maintenance: Most to see how up-to-date she is on lab work and other health maintenance issues.   PERTINENT  PMH / PSH: I have reviewed the patient's medications, allergies, past medical and surgical history, smoking status and updated in the EMR as appropriate.   OBJECTIVE:  BP 126/78   Pulse (!) 53   Ht 5\' 3"  (1.6 m)   Wt 204 lb 3.2 oz (92.6 kg)   SpO2 99%   BMI 36.17 kg/m Vital signs reviewed. GENERAL: Well-developed, well-nourished, no acute distress. CARDIOVASCULAR: Regular rate and rhythm no murmur gallop or rub LUNGS: Clear to auscultation bilaterally, no rales or wheeze. ABDOMEN: Soft positive bowel sounds NEURO: No gross focal neurological deficits. MSK: Movement of extremity x 4. SKIN:   Nothing noted in the gluteal cleft the skin there looks normal. PSYCH: AxOx4. Good eye contact.. No psychomotor retardation or agitation. Appropriate speech fluency and content. Asks and answers questions appropriately. Mood is congruent.     ASSESSMENT / PLAN: Well adult checkup: Regarding the irritated gluteal cleft skin, I would continue to use over-the-counter cortisone, Preparation H or miconazole cream.  Exam today reveals nothing  abnormal.  Essential hypertension Not currently taking any antihypertensives and blood pressure looks good.  Will continue to monitor.  Well adult exam Due her next Pap smear in a year. No immunizations today.  Will check some lab work. Discussed health maintenance issues.  She is due her mammogram.  Seasonal allergic rhinitis due to pollen I would recommend Benadryl at night pretty routinely and then trying addition of Claritin or Zyrtec over-the-counter medication during the day.  If that does not work I would continue those and add Flonase.   Denny Levy MD

## 2022-07-27 NOTE — Assessment & Plan Note (Signed)
I would recommend Benadryl at night pretty routinely and then trying addition of Claritin or Zyrtec over-the-counter medication during the day.  If that does not work I would continue those and add Flonase.

## 2022-08-07 ENCOUNTER — Encounter: Payer: Self-pay | Admitting: *Deleted

## 2022-08-07 NOTE — Progress Notes (Signed)
Pt seen at 07/01/22 screening event where her b/p was 128/76 and her blood sugar was 128. At the event she confirmed that her PCP is Dr. Denny Levy at [facility] and that she does have insurance and she did not identify any SDOH insecurities. She had an "adult wellness exam" on 07/27/22 with Dr. Jennette Kettle. No additional health equity team support indicated at this time.

## 2023-03-07 ENCOUNTER — Ambulatory Visit: Payer: 59 | Admitting: Student

## 2023-03-07 ENCOUNTER — Encounter: Payer: Self-pay | Admitting: Student

## 2023-03-07 VITALS — BP 129/78 | HR 75 | Ht 63.0 in | Wt 208.4 lb

## 2023-03-07 DIAGNOSIS — B353 Tinea pedis: Secondary | ICD-10-CM

## 2023-03-07 MED ORDER — MICONAZOLE NITRATE 2 % EX CREA: 1.0000 | TOPICAL_CREAM | Freq: Two times a day (BID) | CUTANEOUS | 0 refills | Status: DC

## 2023-03-07 MED ORDER — CETIRIZINE HCL 10 MG PO TABS: 10.0000 mg | ORAL_TABLET | Freq: Every day | ORAL | 0 refills | Status: DC

## 2023-03-07 NOTE — Progress Notes (Signed)
    SUBJECTIVE:   CHIEF COMPLAINT / HPI: Itchy Feet  Has been having persistent itchy feet for approximately three weeks.  The itching is not a new symptom, but it usually resolves with the application of clotrimazole cream for a few days This time, the itching has been persistent and has not improved with the intermittent use of clotrimazole cream, medicated powder, and Benadryl  cream.  She has been applying these treatments for a few days at a time, then stopping when the itching subsides, only for the itching to return a day or two later.  She denies any recent exposure to damp areas or walking barefoot outside.  The itching is primarily located on the bottom of the feet, excluding the top part.  PERTINENT  PMH / PSH: HTN  OBJECTIVE:   BP 129/78   Pulse 75   Ht 5' 3 (1.6 m)   Wt 208 lb 6.4 oz (94.5 kg)   SpO2 100%   BMI 36.92 kg/m   General: Well appearing, NAD, awake, alert, responsive to questions Head: Normocephalic atraumatic Respiratory: chest rises symmetrically,  no increased work of breathing Extremities: Moves upper and lower extremities freely, midl flaking at bottom of feet, no ulcers or pitting  ASSESSMENT/PLAN:   Assessment & Plan Tinea pedis of both feet Patient does not want to do clotrimazole cream anymore as a different cream. -Miconazole  cream BID x 2 weeks-discussed doing this daily, if not improved return for possible oral therapy -Zyrtec  for itching -Stop Benadryl  cream  Wendel Lesch, MD [facility]

## 2023-03-07 NOTE — Patient Instructions (Signed)
 It was great to see you! Thank you for allowing me to participate in your care!   Our plans for today:  -Try a different antifungal called miconazole  apply this twice a day every day for 2 weeks to see if this is improving, if it is not improving please return to care as we may need to do an oral treatment  Take care and seek immediate care sooner if you develop any concerns.  Wendel Lesch, MD

## 2023-03-30 ENCOUNTER — Telehealth: Payer: Self-pay | Admitting: Family Medicine

## 2023-03-30 NOTE — Telephone Encounter (Signed)
 Patient called asking to get a referral to [facility] for a diagnostic mammogram.

## 2023-04-02 NOTE — Telephone Encounter (Signed)
 Called patient to inform patient what Dr. Jennette Kettle stated.  Patient said that the [facility] stated that since she has a family history with "breast issues" that they would need to do a diagnostic to see if there is anything going on. Patient still wants a referral to [facility] for a diagnostic. She doesn't want a screening.   Drusilla Kanner, CMA

## 2023-04-02 NOTE — Telephone Encounter (Signed)
 I reviewed her last mammogram and she needs a SCREENING mammogram, not diagnostic. She should be able to call [facility] and set that up. If she has problems,let me know. If they find an abnormality on that, then we get a diagnostic. Please let her know and tell her to call and set up a screening mammography at [facility]. THANKS! Denny Levy

## 2023-04-02 NOTE — Telephone Encounter (Signed)
 Call patient discussed mammograms.  She will schedule a screening mammogram.  She does not need a diagnostic at this time.  She also wanted to have a physical but was told she was not due until June.  We looked back in her Pap smear was last done in 2016 by my review.  She will set up for a Pap smear and we will take care of what ever else she needs at that office visit.  She was happy with this plan.

## 2023-04-25 ENCOUNTER — Other Ambulatory Visit (HOSPITAL_COMMUNITY)
Admission: RE | Admit: 2023-04-25 | Discharge: 2023-04-25 | Disposition: A | Discharge status: Home / Self Care | Source: Ambulatory Visit | Attending: Family Medicine | Admitting: Family Medicine

## 2023-04-25 ENCOUNTER — Encounter: Payer: Self-pay | Admitting: Family Medicine

## 2023-04-25 ENCOUNTER — Ambulatory Visit (INDEPENDENT_AMBULATORY_CARE_PROVIDER_SITE_OTHER): Payer: Medicaid Other | Admitting: Family Medicine

## 2023-04-25 VITALS — BP 166/82 | HR 51 | Ht 63.0 in | Wt 207.0 lb

## 2023-04-25 DIAGNOSIS — B353 Tinea pedis: Secondary | ICD-10-CM

## 2023-04-25 DIAGNOSIS — Z0000 Encounter for general adult medical examination without abnormal findings: Secondary | ICD-10-CM

## 2023-04-25 DIAGNOSIS — Z124 Encounter for screening for malignant neoplasm of cervix: Secondary | ICD-10-CM | POA: Diagnosis not present

## 2023-04-25 DIAGNOSIS — D472 Monoclonal gammopathy: Secondary | ICD-10-CM

## 2023-04-25 NOTE — Patient Instructions (Signed)
 I have given you a form to let you continue to provide plasma twice a week.  I am checking some blood work today and I will let you know about that.  If it looks abnormal in any way, we might have to change her twice weekly donation but for now you are cleared.  I will send you note about your Pap smear.

## 2023-04-26 ENCOUNTER — Encounter: Payer: Self-pay | Admitting: Family Medicine

## 2023-04-26 LAB — COMPREHENSIVE METABOLIC PANEL WITH GFR
ALT: 26 IU/L (ref 0–32)
AST: 23 IU/L (ref 0–40)
Albumin: 4.3 g/dL (ref 3.8–4.9)
Alkaline Phosphatase: 91 IU/L (ref 44–121)
BUN/Creatinine Ratio: 21 (ref 9–23)
BUN: 20 mg/dL (ref 6–24)
Bilirubin Total: 0.2 mg/dL (ref 0.0–1.2)
CO2: 23 mmol/L (ref 20–29)
Calcium: 9.3 mg/dL (ref 8.7–10.2)
Chloride: 104 mmol/L (ref 96–106)
Creatinine, Ser: 0.94 mg/dL (ref 0.57–1.00)
Globulin, Total: 2.8 g/dL (ref 1.5–4.5)
Glucose: 98 mg/dL (ref 70–99)
Potassium: 4.7 mmol/L (ref 3.5–5.2)
Sodium: 139 mmol/L (ref 134–144)
Total Protein: 7.1 g/dL (ref 6.0–8.5)
eGFR: 70 mL/min/{1.73_m2} (ref >59)

## 2023-04-26 LAB — LIPID PANEL
Chol/HDL Ratio: 4.1 ratio (ref 0.0–4.4)
Cholesterol, Total: 231 mg/dL — ABNORMAL HIGH (ref 100–199)
HDL: 56 mg/dL (ref >39)
LDL Chol Calc (NIH): 164 mg/dL — ABNORMAL HIGH (ref 0–99)
Triglycerides: 62 mg/dL (ref 0–149)
VLDL Cholesterol Cal: 11 mg/dL (ref 5–40)

## 2023-04-26 MED ORDER — TERBINAFINE HCL 1 % EX CREA: 1.0000 | TOPICAL_CREAM | Freq: Two times a day (BID) | CUTANEOUS | 3 refills | Status: AC

## 2023-04-26 NOTE — Progress Notes (Signed)
    CHIEF COMPLAINT / HPI: Here for her Pap smear today.  Also wants to follow-up itchy feet.  Has been using over-the-counter Micatin for several weeks since not seeming to help. 3.  Needs a form filled out for plasma donation which she does regularly.  They have questions about her protein level in her blood.   PERTINENT  PMH / PSH: I have reviewed the patient's medications, allergies, past medical and surgical history, smoking status and updated in the EMR as appropriate.   OBJECTIVE:  BP (!) 166/82   Pulse (!) 51   Ht 5\' 3"  (1.6 m)   Wt 207 lb (93.9 kg)   SpO2 100%   BMI 36.67 kg/m  GENERAL: Well-developed no acute distress GU: Externally normal female genitalia.  No adnexal masses or tenderness.  Bimanual exam limited by habitus.  Cervix appears normal.  Pap smear obtained. Feet: Dry skin with some maceration in between the toes that is minimal.  ASSESSMENT / PLAN:  #1.  Well adult.  Will perform Pap smear today, pathology pending 2.  Essential hypertension: Initially elevated blood pressure today with good control on repeat 3.  Tinea pedis: She has been trying over-the-counter miconazole based.  Will switch to terbinafine based and call that in.  She will let me know in 2 weeks how this is going. 4.  History of paraproteinemia: I think this sometimes makes her screening test for the plasma donation center out of the limits they are comfortable with.  It has been a while since we have checked this so I will recheck today.  I filled out a form giving her medical permission for donation, copy to the chart. No problem-specific Assessment & Plan notes found for this encounter.   Denny Levy MD

## 2023-04-30 LAB — CYTOLOGY - PAP
Adequacy: ABSENT
Comment: NEGATIVE
Diagnosis: NEGATIVE
High risk HPV: NEGATIVE

## 2023-05-01 LAB — MULTIPLE MYELOMA CASCADE WITH REFLEX TO SIFE AND SFLC

## 2023-05-02 LAB — MULTIPLE MYELOMA CASCADE WITH REFLEX TO SIFE AND SFLC
A/G Ratio: 1.1 (ref 0.7–1.7)
Albumin ELP: 3.7 g/dL (ref 2.9–4.4)
Alpha 1: 0.2 g/dL (ref 0.0–0.4)
Alpha 2: 0.6 g/dL (ref 0.4–1.0)
Beta: 1.2 g/dL (ref 0.7–1.3)
Gamma Globulin: 1.2 g/dL (ref 0.4–1.8)
Globulin, Total: 3.3 g/dL (ref 2.2–3.9)
M-Spike, %: 0.4 g/dL — ABNORMAL HIGH
Reflex Testing: 0
Total Protein: 7 g/dL (ref 6.0–8.5)

## 2023-05-02 LAB — IMMUNOFIXATION REFLEX, SERUM
IgA/Immunoglobulin A, Serum: 276 mg/dL (ref 87–352)
IgG (Immunoglobin G), Serum: 1310 mg/dL (ref 586–1602)
IgM (Immunoglobulin M), Srm: 75 mg/dL (ref 26–217)

## 2023-05-07 ENCOUNTER — Other Ambulatory Visit: Payer: Self-pay | Admitting: Family Medicine

## 2023-05-07 DIAGNOSIS — D472 Monoclonal gammopathy: Secondary | ICD-10-CM

## 2023-05-07 NOTE — Progress Notes (Signed)
 Spoke w her about IFE panel to follow up abnormal protein lever (at donation center) and her hx of paraproteinemia.  Now has Small M spike which is different from her results of 2014. Not sure if this is meaningful so I recommend (and she agrees) to see hematology for non emergent consult. Reviewed pap results and will repeat in 3 years.(Absent TZ but otherwise normal and she is extremely low risk so we discussed and decided on one at age 58)

## 2023-07-30 ENCOUNTER — Inpatient Hospital Stay

## 2023-07-30 ENCOUNTER — Inpatient Hospital Stay: Admitting: Oncology

## 2023-08-06 ENCOUNTER — Encounter: Payer: Self-pay | Admitting: Oncology

## 2023-08-06 ENCOUNTER — Inpatient Hospital Stay: Attending: Oncology | Admitting: Oncology

## 2023-08-06 ENCOUNTER — Inpatient Hospital Stay

## 2023-08-06 VITALS — BP 133/72 | HR 51 | Temp 98.4°F | Resp 16 | Ht 63.0 in | Wt 195.7 lb

## 2023-08-06 DIAGNOSIS — I10 Essential (primary) hypertension: Secondary | ICD-10-CM | POA: Diagnosis not present

## 2023-08-06 DIAGNOSIS — D472 Monoclonal gammopathy: Secondary | ICD-10-CM | POA: Diagnosis not present

## 2023-08-06 LAB — CBC WITH DIFFERENTIAL (CANCER CENTER ONLY)
Abs Immature Granulocytes: 0.02 K/uL (ref 0.00–0.07)
Basophils Absolute: 0 K/uL (ref 0.0–0.1)
Basophils Relative: 0 %
Eosinophils Absolute: 0.1 K/uL (ref 0.0–0.5)
Eosinophils Relative: 2 %
HCT: 41.8 % (ref 36.0–46.0)
Hemoglobin: 13.1 g/dL (ref 12.0–15.0)
Immature Granulocytes: 0 %
Lymphocytes Relative: 36 %
Lymphs Abs: 1.7 K/uL (ref 0.7–4.0)
MCH: 27.2 pg (ref 26.0–34.0)
MCHC: 31.3 g/dL (ref 30.0–36.0)
MCV: 86.7 fL (ref 80.0–100.0)
Monocytes Absolute: 0.3 K/uL (ref 0.1–1.0)
Monocytes Relative: 7 %
Neutro Abs: 2.5 K/uL (ref 1.7–7.7)
Neutrophils Relative %: 55 %
Platelet Count: 174 K/uL (ref 150–400)
RBC: 4.82 MIL/uL (ref 3.87–5.11)
RDW: 14.7 % (ref 11.5–15.5)
WBC Count: 4.6 K/uL (ref 4.0–10.5)
nRBC: 0 % (ref 0.0–0.2)

## 2023-08-06 LAB — CMP (CANCER CENTER ONLY)
ALT: 17 U/L (ref 0–44)
AST: 21 U/L (ref 15–41)
Albumin: 3.6 g/dL (ref 3.5–5.0)
Alkaline Phosphatase: 58 U/L (ref 38–126)
Anion gap: 8 (ref 5–15)
BUN: 18 mg/dL (ref 6–20)
CO2: 25 mmol/L (ref 22–32)
Calcium: 9.1 mg/dL (ref 8.9–10.3)
Chloride: 109 mmol/L (ref 98–111)
Creatinine: 1.07 mg/dL — ABNORMAL HIGH (ref 0.44–1.00)
GFR, Estimated: 59 mL/min — ABNORMAL LOW (ref >60)
Glucose, Bld: 79 mg/dL (ref 70–99)
Potassium: 4.5 mmol/L (ref 3.5–5.1)
Sodium: 141 mmol/L (ref 135–145)
Total Bilirubin: 0.3 mg/dL (ref 0.0–1.2)
Total Protein: 5.9 g/dL — ABNORMAL LOW (ref 6.5–8.1)

## 2023-08-06 LAB — LACTATE DEHYDROGENASE: LDH: 233 U/L — ABNORMAL HIGH (ref 98–192)

## 2023-08-06 NOTE — Assessment & Plan Note (Addendum)
 She was found to have abnormal protein on screening tests at a plasma donation center.  Hence her PCP Dr. Rosalynn checked multiple myeloma panel on 04/25/2023.  It showed M spike of 0.4 g/dL.  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  Quantitative immunoglobulins were within normal limits.  Hence she was referred to us  for further evaluation of abnormal SPEP.  Previously CMP showed normal creatinine, calcium levels.  Labs today showed normal hemoglobin of 13.1.  White count and platelet count are within normal limits.  Creatinine 1.07, calcium normal at 9.1.  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  Will follow-up on workup from today.  Discussed pathogenesis, prognosis, plan of care for MGUS.  Most likely we will continue surveillance alone.  Initially labs will be repeated every 3 to 6 months and then every 6 months thereafter for monitoring.  She was provided reassurance.  Once results are available from workup today, we will inform patient.  RTC in 4 months for follow-up with labs 2 weeks prior to return visit.

## 2023-08-06 NOTE — Progress Notes (Signed)
 [facility]  HEMATOLOGY CLINIC CONSULTATION NOTE   PATIENT NAME: Carrie Roth   MR#: [id number] DOB: 27-Jun-1965  DATE OF SERVICE: 08/06/2023   REFERRING PROVIDER  Rosalynn Camie CROME, MD   Patient Care Team: Rosalynn Camie CROME, MD as PCP - General   REASON FOR CONSULTATION/ CHIEF COMPLAINT:  Abnormal SPEP  ASSESSMENT & PLAN:  Carrie Roth is a 58 y.o. lady with a past medical history of hypertension, plantar fasciitis, uterine fibroids, was referred to our service for evaluation of abnormal SPEP.    Monoclonal gammopathy She was found to have abnormal protein on screening tests at a plasma donation center.  Hence her PCP Dr. Rosalynn checked multiple myeloma panel on 04/25/2023.  It showed M spike of 0.4 g/dL.  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  Quantitative immunoglobulins were within normal limits.  Hence she was referred to us  for further evaluation of abnormal SPEP.  Previously CMP showed normal creatinine, calcium levels.  Labs today showed normal hemoglobin of 13.1.  White count and platelet count are within normal limits.  Creatinine 1.07, calcium normal at 9.1.  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  Will follow-up on workup from today.  Discussed pathogenesis, prognosis, plan of care for MGUS.  Most likely we will continue surveillance alone.  Initially labs will be repeated every 3 to 6 months and then every 6 months thereafter for monitoring.  She was provided reassurance.  Once results are available from workup today, we will inform patient.  RTC in 4 months for follow-up with labs 2 weeks prior to return visit.   I reviewed lab results and outside records for this visit and discussed relevant results with the patient. Diagnosis, plan of care and treatment options were also discussed in detail with the patient. Opportunity provided to ask questions and answers provided to her apparent  satisfaction. Provided instructions to call our clinic with any problems, questions or concerns prior to return visit. I recommended to continue follow-up with PCP and sub-specialists. She verbalized understanding and agreed with the plan. No barriers to learning was detected.  Chinita Patten, MD  08/06/2023 1:55 PM  [facility] A DEPT OF [facility] [address] [place] [address] Dept: [phone] Dept Fax: [phone]   HISTORY OF PRESENT ILLNESS:  Discussed the use of AI scribe software for clinical note transcription with the patient, who gave verbal consent to proceed.  History of Present Illness  She was found to have abnormal protein on screening tests at a plasma donation center.  Hence her PCP Dr. Rosalynn checked multiple myeloma panel on 04/25/2023.  It showed M spike of 0.4 g/dL.  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  Quantitative immunoglobulins were within normal limits.  Hence she was referred to us  for further evaluation of abnormal SPEP.  She has not experienced symptoms such as low blood counts, kidney damage, or elevated calcium levels, which are potential complications of higher protein levels. No issues with appetite or unintentional weight loss. Her kidney function and calcium levels were normal during previous testing, but her blood counts were not checked at that time.  She denies fever, cough, diarrhea, or other infectious symptoms.  She denies epistaxis, bloody stool, melena, hematuria, bruising or other bleeding symptoms. She also denies unintentional weight loss, night sweats or other constitutional symptoms.  MEDICAL  HISTORY Past Medical History:  Diagnosis Date   Allergy    Fibroids    Hypertension    Pelvic pain in female    Plantar fasciitis, bilateral    SVD (spontaneous vaginal delivery) 1991   x 1     SURGICAL HISTORY Past Surgical History:  Procedure Laterality Date    GYNECOLOGIC CRYOSURGERY     UTERINE ARTERY EMBOLIZATION      uterine artery embolization performed on 12/29/2014   WISDOM TOOTH EXTRACTION       SOCIAL HISTORY: She reports that she has never smoked. She has never used smokeless tobacco. She reports that she does not drink alcohol and does not use drugs. Social History   Socioeconomic History   Marital status: Single    Spouse name: Not on file   Number of children: Not on file   Years of education: Not on file   Highest education level: Not on file  Occupational History   Not on file  Tobacco Use   Smoking status: Never   Smokeless tobacco: Never  Vaping Use   Vaping status: Never Used  Substance and Sexual Activity   Alcohol use: No   Drug use: No   Sexual activity: Not Currently    Birth control/protection: None  Other Topics Concern   Not on file  Social History Narrative   Not on file   Social Drivers of Health   Financial Resource Strain: Not on file  Food Insecurity: No Food Insecurity (08/06/2023)   Hunger Vital Sign    Worried About Running Out of Food in the Last Year: Never true    Ran Out of Food in the Last Year: Never true  Transportation Needs: No Transportation Needs (08/06/2023)   PRAPARE - Administrator, Civil Service (Medical): No    Lack of Transportation (Non-Medical): No  Physical Activity: Not on file  Stress: Not on file  Social Connections: Not on file  Intimate Partner Violence: Not At Risk (08/06/2023)   Humiliation, Afraid, Rape, and Kick questionnaire    Fear of Current or Ex-Partner: No    Emotionally Abused: No    Physically Abused: No    Sexually Abused: No    FAMILY HISTORY: Her family history includes Cancer in her mother; Diabetes in her mother.  CURRENT MEDICATIONS   Current Outpatient Medications  Medication Instructions   terbinafine  (LAMISIL ) 1 % cream 1 Application, Topical, 2 times daily     ALLERGIES  She is allergic to other and no known  allergies.  REVIEW OF SYSTEMS:  Review of Systems - Oncology   Rest of the pertinent review of systems is unremarkable except as mentioned above in HPI.  PHYSICAL EXAMINATION:     Onc Performance Status - 08/06/23 1018       ECOG Perf Status   ECOG Perf Status Fully active, able to carry on all pre-disease performance without restriction      KPS SCALE   KPS % SCORE Normal, no compliants, no evidence of disease          Vitals:   08/06/23 1016  BP: 133/72  Pulse: (!) 51  Resp: 16  Temp: 98.4 F (36.9 C)  SpO2: 100%   Filed Weights   08/06/23 1016  Weight: 195 lb 11.2 oz (88.8 kg)    Physical Exam Constitutional:      General: She is not in acute distress.    Appearance: Normal appearance.  HENT:     Head: Normocephalic  and atraumatic.  Cardiovascular:     Rate and Rhythm: Normal rate.  Pulmonary:     Effort: Pulmonary effort is normal. No respiratory distress.  Abdominal:     General: There is no distension.  Neurological:     General: No focal deficit present.     Mental Status: She is alert and oriented to person, place, and time.  Psychiatric:        Mood and Affect: Mood normal.        Behavior: Behavior normal.       LABORATORY DATA:   I have reviewed the data as listed.  Results for orders placed or performed in visit on 08/06/23  Lactate dehydrogenase  Result Value Ref Range   LDH 233 (H) 98 - 192 U/L  CMP (Cancer Center only)  Result Value Ref Range   Sodium 141 135 - 145 mmol/L   Potassium 4.5 3.5 - 5.1 mmol/L   Chloride 109 98 - 111 mmol/L   CO2 25 22 - 32 mmol/L   Glucose, Bld 79 70 - 99 mg/dL   BUN 18 6 - 20 mg/dL   Creatinine 8.92 (H) 9.55 - 1.00 mg/dL   Calcium 9.1 8.9 - 89.6 mg/dL   Total Protein 5.9 (L) 6.5 - 8.1 g/dL   Albumin 3.6 3.5 - 5.0 g/dL   AST 21 15 - 41 U/L   ALT 17 0 - 44 U/L   Alkaline Phosphatase 58 38 - 126 U/L   Total Bilirubin 0.3 0.0 - 1.2 mg/dL   GFR, Estimated 59 (L) >60 mL/min   Anion gap 8 5 -  15  CBC with Differential (Cancer Center Only)  Result Value Ref Range   WBC Count 4.6 4.0 - 10.5 K/uL   RBC 4.82 3.87 - 5.11 MIL/uL   Hemoglobin 13.1 12.0 - 15.0 g/dL   HCT 58.1 63.9 - 53.9 %   MCV 86.7 80.0 - 100.0 fL   MCH 27.2 26.0 - 34.0 pg   MCHC 31.3 30.0 - 36.0 g/dL   RDW 85.2 88.4 - 84.4 %   Platelet Count 174 150 - 400 K/uL   nRBC 0.0 0.0 - 0.2 %   Neutrophils Relative % 55 %   Neutro Abs 2.5 1.7 - 7.7 K/uL   Lymphocytes Relative 36 %   Lymphs Abs 1.7 0.7 - 4.0 K/uL   Monocytes Relative 7 %   Monocytes Absolute 0.3 0.1 - 1.0 K/uL   Eosinophils Relative 2 %   Eosinophils Absolute 0.1 0.0 - 0.5 K/uL   Basophils Relative 0 %   Basophils Absolute 0.0 0.0 - 0.1 K/uL   Immature Granulocytes 0 %   Abs Immature Granulocytes 0.02 0.00 - 0.07 K/uL     RADIOGRAPHIC STUDIES:  No pertinent imaging studies available to review.  Orders Placed This Encounter  Procedures   CBC with Differential (Cancer Center Only)    Standing Status:   Future    Number of Occurrences:   1    Expiration Date:   08/05/2024   CMP (Cancer Center only)    Standing Status:   Future    Number of Occurrences:   1    Expiration Date:   08/05/2024   Lactate dehydrogenase    Standing Status:   Future    Number of Occurrences:   1    Expiration Date:   08/05/2024   Multiple Myeloma Panel (SPEP&IFE w/QIG)    Standing Status:   Future    Number of Occurrences:  1    Expiration Date:   08/05/2024   Kappa/lambda light chains    Standing Status:   Future    Number of Occurrences:   1    Expiration Date:   08/05/2024   Beta 2 microglobulin, serum    Standing Status:   Future    Number of Occurrences:   1    Expiration Date:   08/05/2024    Future Appointments  Date Time Provider Department Center  11/23/2023  8:30 AM [facility] PHLEBOTOMIST [facility] None  12/10/2023  8:45 AM Lonell Stamos, Chinita, MD [facility] None     I spent a total of 40 minutes during this encounter with the patient including review of  chart and various tests results, discussions about plan of care and coordination of care plan.  This document was completed utilizing speech recognition software. Grammatical errors, random word insertions, pronoun errors, and incomplete sentences are an occasional consequence of this system due to software limitations, ambient noise, and hardware issues. Any formal questions or concerns about the content, text or information contained within the body of this dictation should be directly addressed to the provider for clarification.

## 2023-08-07 LAB — KAPPA/LAMBDA LIGHT CHAINS
Kappa free light chain: 21.5 mg/L — ABNORMAL HIGH (ref 3.3–19.4)
Kappa, lambda light chain ratio: 0.87 (ref 0.26–1.65)
Lambda free light chains: 24.8 mg/L (ref 5.7–26.3)

## 2023-08-07 LAB — BETA 2 MICROGLOBULIN, SERUM: Beta-2 Microglobulin: 1.7 mg/L (ref 0.6–2.4)

## 2023-08-09 ENCOUNTER — Other Ambulatory Visit: Payer: Self-pay

## 2023-08-09 LAB — MULTIPLE MYELOMA PANEL, SERUM
Albumin SerPl Elph-Mcnc: 3.1 g/dL (ref 2.9–4.4)
Albumin/Glob SerPl: 1.4 (ref 0.7–1.7)
Alpha 1: 0.2 g/dL (ref 0.0–0.4)
Alpha2 Glob SerPl Elph-Mcnc: 0.5 g/dL (ref 0.4–1.0)
B-Globulin SerPl Elph-Mcnc: 1 g/dL (ref 0.7–1.3)
Gamma Glob SerPl Elph-Mcnc: 0.6 g/dL (ref 0.4–1.8)
Globulin, Total: 2.3 g/dL (ref 2.2–3.9)
IgA: 187 mg/dL (ref 87–352)
IgG (Immunoglobin G), Serum: 791 mg/dL (ref 586–1602)
IgM (Immunoglobulin M), Srm: 37 mg/dL (ref 26–217)
M Protein SerPl Elph-Mcnc: 0.2 g/dL — ABNORMAL HIGH
Total Protein ELP: 5.4 g/dL — ABNORMAL LOW (ref 6.0–8.5)

## 2023-08-09 NOTE — Progress Notes (Signed)
 Per patient request and Dr. Clovis input, the following lab results from patient's last visit with Dr. Autumn were relayed per phone call to the patient:  CBCD and CMP looked unremarkable. Kappa/lambda light chain ratio also normal. Myeloma labs are pending but otherwise everything looks good.   Patient seemed satisfied and will check but later regarding the Myeloma results.

## 2023-10-05 ENCOUNTER — Ambulatory Visit (INDEPENDENT_AMBULATORY_CARE_PROVIDER_SITE_OTHER): Admitting: Family Medicine

## 2023-10-05 VITALS — BP 156/73 | HR 51 | Ht 63.0 in | Wt 181.2 lb

## 2023-10-05 DIAGNOSIS — M79601 Pain in right arm: Secondary | ICD-10-CM | POA: Diagnosis present

## 2023-10-05 DIAGNOSIS — I10 Essential (primary) hypertension: Secondary | ICD-10-CM

## 2023-10-05 NOTE — Assessment & Plan Note (Signed)
 Elevated BP in clinic today, patient adamant that she does not typically have high blood pressure and would not like to start medications at this time.  Advised that she try to keep track of her blood pressure at home and follow-up with PCP.

## 2023-10-05 NOTE — Patient Instructions (Addendum)
 Try Voltaren (diclofenac) gel on your arm - this is available over the counter  You can also try Aleve or Ibuprofen  as needed to help reduce inflammation  Please let us  know if your pain becomes worse or you develop any shooting pains, weakness or numbness in your arm, or fevers or swelling  I recommend you keep track of your blood pressure at home. A cuff I recommend is Omron, you can find these over the counter. Please keep track daily and try to follow up with the log in the next few weeks with your PCP.

## 2023-10-05 NOTE — Progress Notes (Signed)
    SUBJECTIVE:   CHIEF COMPLAINT / HPI:   R shoulder/upper arm pain for 3 weeks Experiencing sharp intermittent pains in lateral R upper arm between triceps and biceps Has not been taking medication for it She denies any pain with movement of her arm or lifting it above her head Denies any radiating pain down the arm.  No pain with neck movement Currently denies pain No fevers, numbness/weakness.  No recent injury  PERTINENT  PMH / PSH: MGUS  OBJECTIVE:   BP (!) 156/73   Pulse (!) 51   Ht 5' 3 (1.6 m)   Wt 181 lb 4 oz (82.2 kg)   SpO2 100%   BMI 32.11 kg/m   General: NAD, pleasant, able to participate in exam Respiratory: No respiratory distress Skin: warm and dry, no rashes noted Psych: Normal affect and mood  MSK R arm: No gross deformity, ecchymosis, swelling Nontender to palpation throughout clavicle, shoulder, upper arm, elbow, lower arm FROM of shoulder and elbow without pain 5/5 strength of shoulder and arm Negative Hawkins, Neer's, empty can, Yergason's Sensation intact throughout right upper extremity and radial pulse 2+  ASSESSMENT/PLAN:   Assessment & Plan Right arm pain Unclear etiology.  Does not appear to be related to rotator cuff.  Possibly some soft tissue inflammation.  No injury.  Low suspicion for bony abnormality.  Provided reassurance and advised she can try anti-inflammatory medications and/or Voltaren gel over the area to see if that provides benefit.  Discussed return precautions Essential hypertension Elevated BP in clinic today, patient adamant that she does not typically have high blood pressure and would not like to start medications at this time.  Advised that she try to keep track of her blood pressure at home and follow-up with PCP.   Payton Coward, MD [facility]

## 2023-10-12 ENCOUNTER — Other Ambulatory Visit: Payer: Self-pay | Admitting: Family Medicine

## 2023-10-12 DIAGNOSIS — Z1231 Encounter for screening mammogram for malignant neoplasm of breast: Secondary | ICD-10-CM

## 2023-10-16 ENCOUNTER — Encounter: Payer: Self-pay | Admitting: Family Medicine

## 2023-10-26 ENCOUNTER — Encounter: Payer: Self-pay | Admitting: Radiology

## 2023-10-26 ENCOUNTER — Ambulatory Visit
Admission: RE | Admit: 2023-10-26 | Discharge: 2023-10-26 | Disposition: A | Discharge status: Home / Self Care | Source: Ambulatory Visit | Attending: Family Medicine | Admitting: Family Medicine

## 2023-10-26 DIAGNOSIS — Z1231 Encounter for screening mammogram for malignant neoplasm of breast: Secondary | ICD-10-CM

## 2023-11-23 ENCOUNTER — Inpatient Hospital Stay: Attending: Oncology

## 2023-11-23 DIAGNOSIS — D472 Monoclonal gammopathy: Secondary | ICD-10-CM | POA: Insufficient documentation

## 2023-11-23 LAB — CMP (CANCER CENTER ONLY)
ALT: 34 U/L (ref 0–44)
AST: 31 U/L (ref 15–41)
Albumin: 3.5 g/dL (ref 3.5–5.0)
Alkaline Phosphatase: 58 U/L (ref 38–126)
Anion gap: 7 (ref 5–15)
BUN: 22 mg/dL — ABNORMAL HIGH (ref 6–20)
CO2: 25 mmol/L (ref 22–32)
Calcium: 8.9 mg/dL (ref 8.9–10.3)
Chloride: 108 mmol/L (ref 98–111)
Creatinine: 1.01 mg/dL — ABNORMAL HIGH (ref 0.44–1.00)
GFR, Estimated: >60 mL/min (ref >60)
Glucose, Bld: 95 mg/dL (ref 70–99)
Potassium: 4.2 mmol/L (ref 3.5–5.1)
Sodium: 140 mmol/L (ref 135–145)
Total Bilirubin: 0.3 mg/dL (ref 0.0–1.2)
Total Protein: 5.8 g/dL — ABNORMAL LOW (ref 6.5–8.1)

## 2023-11-23 LAB — CBC WITH DIFFERENTIAL (CANCER CENTER ONLY)
Abs Immature Granulocytes: 0.01 K/uL (ref 0.00–0.07)
Basophils Absolute: 0 K/uL (ref 0.0–0.1)
Basophils Relative: 1 %
Eosinophils Absolute: 0.1 K/uL (ref 0.0–0.5)
Eosinophils Relative: 2 %
HCT: 44.4 % (ref 36.0–46.0)
Hemoglobin: 14.2 g/dL (ref 12.0–15.0)
Immature Granulocytes: 0 %
Lymphocytes Relative: 33 %
Lymphs Abs: 1.6 K/uL (ref 0.7–4.0)
MCH: 28.7 pg (ref 26.0–34.0)
MCHC: 32 g/dL (ref 30.0–36.0)
MCV: 89.7 fL (ref 80.0–100.0)
Monocytes Absolute: 0.3 K/uL (ref 0.1–1.0)
Monocytes Relative: 5 %
Neutro Abs: 2.8 K/uL (ref 1.7–7.7)
Neutrophils Relative %: 59 %
Platelet Count: 195 K/uL (ref 150–400)
RBC: 4.95 MIL/uL (ref 3.87–5.11)
RDW: 13.7 % (ref 11.5–15.5)
WBC Count: 4.8 K/uL (ref 4.0–10.5)
nRBC: 0 % (ref 0.0–0.2)

## 2023-11-23 LAB — LACTATE DEHYDROGENASE: LDH: 266 U/L — ABNORMAL HIGH (ref 98–192)

## 2023-11-26 LAB — KAPPA/LAMBDA LIGHT CHAINS
Kappa free light chain: 18.5 mg/L (ref 3.3–19.4)
Kappa, lambda light chain ratio: 0.83 (ref 0.26–1.65)
Lambda free light chains: 22.4 mg/L (ref 5.7–26.3)

## 2023-11-29 LAB — MULTIPLE MYELOMA PANEL, SERUM
Albumin SerPl Elph-Mcnc: 2.9 g/dL (ref 2.9–4.4)
Albumin/Glob SerPl: 1.3 (ref 0.7–1.7)
Alpha 1: 0.3 g/dL (ref 0.0–0.4)
Alpha2 Glob SerPl Elph-Mcnc: 0.5 g/dL (ref 0.4–1.0)
B-Globulin SerPl Elph-Mcnc: 0.9 g/dL (ref 0.7–1.3)
Gamma Glob SerPl Elph-Mcnc: 0.6 g/dL (ref 0.4–1.8)
Globulin, Total: 2.4 g/dL (ref 2.2–3.9)
IgA: 166 mg/dL (ref 87–352)
IgG (Immunoglobin G), Serum: 723 mg/dL (ref 586–1602)
IgM (Immunoglobulin M), Srm: 37 mg/dL (ref 26–217)
M Protein SerPl Elph-Mcnc: 0.2 g/dL — ABNORMAL HIGH
Total Protein ELP: 5.3 g/dL — ABNORMAL LOW (ref 6.0–8.5)

## 2023-12-10 ENCOUNTER — Inpatient Hospital Stay: Attending: Oncology | Admitting: Oncology

## 2023-12-10 ENCOUNTER — Encounter: Payer: Self-pay | Admitting: Oncology

## 2023-12-10 VITALS — BP 155/71 | HR 51 | Temp 98.0°F | Resp 18 | Ht 63.0 in | Wt 187.1 lb

## 2023-12-10 DIAGNOSIS — D472 Monoclonal gammopathy: Secondary | ICD-10-CM | POA: Diagnosis not present

## 2023-12-10 NOTE — Assessment & Plan Note (Signed)
 She was found to have abnormal protein on screening tests at a plasma donation center.  Hence her PCP Dr. Rosalynn checked multiple myeloma panel on 04/25/2023.  It showed M spike of 0.4 g/dL.  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  Quantitative immunoglobulins were within normal limits.  Hence she was referred to us  for further evaluation of abnormal SPEP.  On review of records, she had SPEP/ IFE in May 2014 when M-spike was 0.66 g/dL, IFE showed it to be IgG Lambda type.  She did not have formal hematology evaluation at that time.  On her consultation with us  on 08/06/2023, labs showed normal hemoglobin of 13.1.  White count and platelet count were within normal limits.  Creatinine 1.07, calcium normal at 9.1.  SPEP showed 0.2 g/dL of M spike.  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  Quantitative immunoglobulins were all within normal limits.  Serum free kappa was slightly increased at 21.5 mg/L, lambda was normal at 24.8 mg/L, ratio normal at 0.87.  Beta-2  microglobulin was normal at 1.7 mg/L.  Repeat myeloma labs from 11/23/2023 showed stable M protein of 0.2 g/dL.  Kappa/lambda light chain ratio was normal with normal serum free light chains.  Quantitate immunoglobulins were all within normal limits.  Creatinine 1.01, calcium 8.9, hemoglobin 14.2.   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  Discussed pathogenesis, prognosis, plan of care for MGUS.  We will continue surveillance alone. Initially labs will be repeated every 3 to 6 months and then every 6 months thereafter for monitoring. She was provided reassurance.  RTC in 6 months for follow-up with labs 2 weeks prior to return visit.

## 2023-12-10 NOTE — Progress Notes (Signed)
 [facility]  HEMATOLOGY CLINIC PROGRESS NOTE  PATIENT NAME: Carrie Roth   MR#: [id number] DOB: [birth year]  Patient Care Team: Rosalynn Camie CROME, MD as PCP - General  Date of visit: 12/10/2023   ASSESSMENT & PLAN:   Carrie Roth is a 58 y.o. lady with a past medical history of hypertension, plantar fasciitis, uterine fibroids, was referred to our service in July 2025 for evaluation of abnormal SPEP. 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  Monoclonal gammopathy of undetermined significance (MGUS) She was found to have abnormal protein on screening tests at a plasma donation center.  Hence her PCP Dr. Rosalynn checked multiple myeloma panel on 04/25/2023.  It showed M spike of 0.4 g/dL.  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  Quantitative immunoglobulins were within normal limits.  Hence she was referred to us  for further evaluation of abnormal SPEP.  On review of records, she had SPEP/ IFE in May 2014 when M-spike was 0.66 g/dL, IFE showed it to be IgG Lambda type.  She did not have formal hematology evaluation at that time.  On her consultation with us  on 08/06/2023, labs showed normal hemoglobin of 13.1.  White count and platelet count were within normal limits.  Creatinine 1.07, calcium normal at 9.1.  SPEP showed 0.2 g/dL of M spike.  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  Quantitative immunoglobulins were all within normal limits.  Serum free kappa was slightly increased at 21.5 mg/L, lambda was normal at 24.8 mg/L, ratio normal at 0.87.  Beta-2  microglobulin was normal at 1.7 mg/L.  Repeat myeloma labs from 11/23/2023 showed stable M protein of 0.2 g/dL.  Kappa/lambda light chain ratio was normal with normal serum free light chains.  Quantitate immunoglobulins were all within normal limits.  Creatinine 1.01, calcium 8.9, hemoglobin 14.2.   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  Discussed pathogenesis, prognosis, plan of  care for MGUS.  We will continue surveillance alone. Initially labs will be repeated every 3 to 6 months and then every 6 months thereafter for monitoring. She was provided reassurance.  RTC in 6 months for follow-up with labs 2 weeks prior to return visit.   I spent a total of 30 minutes during this encounter with the patient including review of chart and various tests results, discussions about plan of care and coordination of care plan.  I reviewed lab results and outside records for this visit and discussed relevant results with the patient. Diagnosis, plan of care and treatment options were also discussed in detail with the patient. Opportunity provided to ask questions and answers provided to her apparent satisfaction. Provided instructions to call our clinic with any problems, questions or concerns prior to return visit. I recommended to continue follow-up with PCP and sub-specialists. She verbalized understanding and agreed with the plan. No barriers to learning was detected.  Chinita Patten, MD  12/10/2023 11:50 AM  [facility] - A DEPT OF [facility] [address] [place] [address] Dept: [phone] Dept Fax: [phone]   CHIEF COMPLAINT/ REASON FOR VISIT:  Follow-up for IgG lambda MGUS.  INTERVAL HISTORY:  Discussed the use of AI scribe software for clinical note transcription with the patient, who gave verbal consent to proceed.  History of Present Illness Carrie Roth is a 59 year old female with MGUS who presents for follow-up of her M  protein levels.  Her M protein was first identified in 2014 with a level of 0.66 g/dL, and she has since experienced fluctuations, with a recent decrease to 0.2 g/dL. She has been monitoring her lab results and noted slightly elevated kappa light chains at 21.5 mg/L. Her creatinine is also slightly elevated, which she attributes to a high protein intake due to her  exercise regimen.  She has been educating herself about MGUS and its potential progression to multiple myeloma.  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    SUMMARY OF HEMATOLOGIC HISTORY:  She was found to have abnormal protein on screening tests at a plasma donation center.  Hence her PCP Dr. Rosalynn checked multiple myeloma panel on 04/25/2023.  It showed M spike of 0.4 g/dL.  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  Quantitative immunoglobulins were within normal limits.  Hence she was referred to us  for further evaluation of abnormal SPEP.  On review of records, she had SPEP/ IFE in May 2014 when M-spike was 0.66 g/dL, IFE showed it to be IgG Lambda type.  She did not have formal hematology evaluation at that time.   She has not experienced symptoms such as low blood counts, kidney damage, or elevated calcium levels, which are potential complications of higher protein levels. No issues with appetite or unintentional weight loss. Her kidney function and calcium levels were normal during previous testing, but her blood counts were not checked at that time.  On her consultation with us  on 08/06/2023, labs showed normal hemoglobin of 13.1.  White count and platelet count were within normal limits.  Creatinine 1.07, calcium normal at 9.1.  SPEP showed 0.2 g/dL of M spike.  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  Quantitative immunoglobulins were all within normal limits.  Serum free kappa was slightly increased at 21.5 mg/L, lambda was normal at 24.8 mg/L, ratio normal at 0.87.  Beta-2  microglobulin was normal at 1.7 mg/L.   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    We will continue surveillance alone. Initially labs will be repeated every 3 to 6 months and then every 6 months thereafter for monitoring. She was provided reassurance.  I have reviewed the  past medical history, past surgical history, social history and family history with the patient and they are unchanged from previous note.  ALLERGIES: She is allergic to other and no known allergies.  MEDICATIONS:  Current Outpatient Medications  Medication Sig Dispense Refill   terbinafine  (LAMISIL ) 1 % cream Apply 1 Application topically 2 (two) times daily. (Patient not taking: Reported on 08/06/2023) 30 g 3   No current facility-administered medications for this visit.     REVIEW OF SYSTEMS:    Review of Systems - Oncology  All other pertinent systems were reviewed with the patient and are negative.  PHYSICAL EXAMINATION:   Onc Performance Status - 12/10/23 0911       ECOG Perf Status   ECOG Perf Status Fully active, able to carry on all pre-disease performance without restriction      KPS SCALE   KPS % SCORE Able to carry on normal activity, minor s/s of disease          Vitals:   12/10/23 0858 12/10/23 0902  BP: (!) 165/80 (!) 155/71  Pulse: (!) 51   Resp: 18  Temp: 98 F (36.7 C)   SpO2: 100%    Filed Weights   12/10/23 0858  Weight: 187 lb 1.6 oz (84.9 kg)    Physical Exam Constitutional:      General: She is not in acute distress.    Appearance: Normal appearance.  HENT:     Head: Normocephalic and atraumatic.  Cardiovascular:     Rate and Rhythm: Normal rate.  Pulmonary:     Effort: Pulmonary effort is normal. No respiratory distress.  Abdominal:     General: There is no distension.  Neurological:     General: No focal deficit present.     Mental Status: She is alert and oriented to person, place, and time.  Psychiatric:        Mood and Affect: Mood normal.        Behavior: Behavior normal.     LABORATORY DATA:   I have reviewed the data as listed.  Recent Results (from the past 2160 hours)  Kappa/lambda light chains     Status: None   Collection Time: 11/23/23  8:45 AM  Result Value Ref Range   Kappa free light chain 18.5 3.3 -  19.4 mg/L   Lambda free light chains 22.4 5.7 - 26.3 mg/L   Kappa, lambda light chain ratio 0.83 0.26 - 1.65    Comment: (NOTE) Performed At: [facility] [place] [address] [place], [place] [id number] Jennette Shorter MD Ey:[id number]   Multiple Myeloma Panel (SPEP&IFE w/QIG)     Status: Abnormal   Collection Time: 11/23/23  8:45 AM  Result Value Ref Range   IgG (Immunoglobin G), Serum 723 586 - 1,602 mg/dL   IgA 833 87 - 647 mg/dL   IgM (Immunoglobulin M), Srm 37 26 - 217 mg/dL   Total Protein ELP 5.3 (L) 6.0 - 8.5 g/dL   Albumin SerPl Elph-Mcnc 2.9 2.9 - 4.4 g/dL   Alpha 1 0.3 0.0 - 0.4 g/dL   Alpha2 Glob SerPl Elph-Mcnc 0.5 0.4 - 1.0 g/dL   B-Globulin SerPl Elph-Mcnc 0.9 0.7 - 1.3 g/dL   Gamma Glob SerPl Elph-Mcnc 0.6 0.4 - 1.8 g/dL   M Protein SerPl Elph-Mcnc 0.2 (H) Not Observed g/dL   Globulin, Total 2.4 2.2 - 3.9 g/dL   Albumin/Glob SerPl 1.3 0.7 - 1.7   IFE 1 Comment (A)     Comment: (NOTE) Immunofixation shows IgG monoclonal protein with lambda light chain specificity.    Please Note Comment     Comment: (NOTE) Protein electrophoresis scan will follow via computer, mail, or courier delivery. Performed At: [facility] [address] [place], [place] [id number] Jennette Shorter MD Ey:[id number]   Lactate dehydrogenase     Status: Abnormal   Collection Time: 11/23/23  8:45 AM  Result Value Ref Range   LDH 266 (H) 98 - 192 U/L    Comment: Performed at [facility], [address] [place], [place] [address]  CMP (Cancer Center only)     Status: Abnormal   Collection Time: 11/23/23  8:45 AM  Result Value Ref Range   Sodium 140 135 - 145 mmol/L   Potassium 4.2 3.5 - 5.1 mmol/L   Chloride 108 98 - 111 mmol/L   CO2 25 22 - 32 mmol/L   Glucose, Bld 95 70 - 99 mg/dL    Comment: Glucose reference range applies only to samples taken after fasting for at least 8 hours.   BUN 22 (H) 6 - 20 mg/dL   Creatinine 8.98 (H) 9.55 -  1.00 mg/dL    Calcium 8.9 8.9 - 89.6 mg/dL   Total Protein 5.8 (L) 6.5 - 8.1 g/dL   Albumin 3.5 3.5 - 5.0 g/dL   AST 31 15 - 41 U/L   ALT 34 0 - 44 U/L   Alkaline Phosphatase 58 38 - 126 U/L   Total Bilirubin 0.3 0.0 - 1.2 mg/dL   GFR, Estimated >39 >39 mL/min    Comment: (NOTE) Calculated using the CKD-EPI Creatinine Equation (2021)    Anion gap 7 5 - 15    Comment: Performed at [facility], [address] [place], [place] [address]  CBC with Differential (Cancer Center Only)     Status: None   Collection Time: 11/23/23  8:45 AM  Result Value Ref Range   WBC Count 4.8 4.0 - 10.5 K/uL   RBC 4.95 3.87 - 5.11 MIL/uL   Hemoglobin 14.2 12.0 - 15.0 g/dL   HCT 55.5 63.9 - 53.9 %   MCV 89.7 80.0 - 100.0 fL   MCH 28.7 26.0 - 34.0 pg   MCHC 32.0 30.0 - 36.0 g/dL   RDW 86.2 88.4 - 84.4 %   Platelet Count 195 150 - 400 K/uL   nRBC 0.0 0.0 - 0.2 %   Neutrophils Relative % 59 %   Neutro Abs 2.8 1.7 - 7.7 K/uL   Lymphocytes Relative 33 %   Lymphs Abs 1.6 0.7 - 4.0 K/uL   Monocytes Relative 5 %   Monocytes Absolute 0.3 0.1 - 1.0 K/uL   Eosinophils Relative 2 %   Eosinophils Absolute 0.1 0.0 - 0.5 K/uL   Basophils Relative 1 %   Basophils Absolute 0.0 0.0 - 0.1 K/uL   Immature Granulocytes 0 %   Abs Immature Granulocytes 0.01 0.00 - 0.07 K/uL    Comment: Performed at [facility], [address], [place], [place] [address]      RADIOGRAPHIC STUDIES:  No recent pertinent imaging studies available to review.  Orders Placed This Encounter  Procedures   CBC with Differential (Cancer Center Only)    Standing Status:   Future    Expected Date:   06/08/2024    Expiration Date:   09/06/2024   CMP (Cancer Center only)    Standing Status:   Future    Expected Date:   06/08/2024    Expiration Date:   09/06/2024   Lactate dehydrogenase    Standing Status:   Future    Expected Date:   06/08/2024    Expiration Date:   09/06/2024   Multiple Myeloma Panel (SPEP&IFE  w/QIG)    Standing Status:   Future    Expected Date:   06/08/2024    Expiration Date:   09/06/2024   Kappa/lambda light chains    Standing Status:   Future    Expected Date:   06/08/2024    Expiration Date:   09/06/2024    Future Appointments  Date Time Provider Department Center  05/26/2024  8:00 AM [facility] PHLEBOTOMIST [facility] None  06/09/2024  8:30 AM Calyn Sivils, Chinita, MD [facility] None     This document was completed utilizing speech recognition software. Grammatical errors, random word insertions, pronoun errors, and incomplete sentences are an occasional consequence of this system due to software limitations, ambient noise, and hardware issues. Any formal questions or concerns about the content, text or information contained within the body of this dictation should be directly addressed to the provider for clarification.

## 2024-02-12 ENCOUNTER — Ambulatory Visit (INDEPENDENT_AMBULATORY_CARE_PROVIDER_SITE_OTHER): Payer: Self-pay

## 2024-02-12 DIAGNOSIS — Z23 Encounter for immunization: Secondary | ICD-10-CM

## 2024-02-12 NOTE — Progress Notes (Signed)
 Patient presents to nurse clinic for flu vaccine.  Vaccine given without complication.  See admin for details.

## 2024-05-26 ENCOUNTER — Inpatient Hospital Stay

## 2024-06-09 ENCOUNTER — Inpatient Hospital Stay: Admitting: Oncology
